# Patient Record
Sex: Female | Born: 1960 | Race: Black or African American | Hispanic: No | Marital: Married | Smoking: Never smoker
Health system: Southern US, Community
[De-identification: ages and names within clinical notes are randomized; demographics above are authoritative.]

## PROBLEM LIST (undated history)

## (undated) DIAGNOSIS — A64 Unspecified sexually transmitted disease: Secondary | ICD-10-CM

## (undated) HISTORY — PX: REDUCTION MAMMAPLASTY: SUR839

## (undated) HISTORY — PX: TUBAL LIGATION: SHX77

## (undated) HISTORY — DX: Unspecified sexually transmitted disease: A64

## (undated) HISTORY — PX: CHOLECYSTECTOMY: SHX55

## (undated) HISTORY — PX: FOOT SURGERY: SHX648

## (undated) HISTORY — PX: PARATHYROIDECTOMY: SHX19

## (undated) HISTORY — PX: VAGINAL HYSTERECTOMY: SUR661

## (undated) HISTORY — PX: BREAST SURGERY: SHX581

---

## 1998-08-01 ENCOUNTER — Other Ambulatory Visit: Admission: RE | Admit: 1998-08-01 | Discharge: 1998-08-01 | Payer: Self-pay | Admitting: Obstetrics and Gynecology

## 1999-04-21 ENCOUNTER — Ambulatory Visit (HOSPITAL_COMMUNITY): Admission: RE | Admit: 1999-04-21 | Discharge: 1999-04-21 | Payer: Self-pay | Admitting: Obstetrics and Gynecology

## 1999-04-21 ENCOUNTER — Encounter: Payer: Self-pay | Admitting: Obstetrics and Gynecology

## 1999-08-17 ENCOUNTER — Other Ambulatory Visit: Admission: RE | Admit: 1999-08-17 | Discharge: 1999-08-17 | Payer: Self-pay | Admitting: Obstetrics and Gynecology

## 1999-11-08 ENCOUNTER — Emergency Department (HOSPITAL_COMMUNITY): Admission: EM | Admit: 1999-11-08 | Discharge: 1999-11-08 | Payer: Self-pay | Admitting: Emergency Medicine

## 1999-11-11 ENCOUNTER — Inpatient Hospital Stay (HOSPITAL_COMMUNITY): Admission: EM | Admit: 1999-11-11 | Discharge: 1999-11-15 | Payer: Self-pay | Admitting: *Deleted

## 1999-11-11 ENCOUNTER — Encounter: Payer: Self-pay | Admitting: Family Medicine

## 1999-11-11 ENCOUNTER — Encounter: Admission: RE | Admit: 1999-11-11 | Discharge: 1999-11-11 | Payer: Self-pay | Admitting: Family Medicine

## 1999-11-11 ENCOUNTER — Encounter (INDEPENDENT_AMBULATORY_CARE_PROVIDER_SITE_OTHER): Payer: Self-pay | Admitting: Specialist

## 2000-01-07 ENCOUNTER — Emergency Department (HOSPITAL_COMMUNITY): Admission: EM | Admit: 2000-01-07 | Discharge: 2000-01-07 | Payer: Self-pay

## 2000-01-17 ENCOUNTER — Emergency Department (HOSPITAL_COMMUNITY): Admission: EM | Admit: 2000-01-17 | Discharge: 2000-01-17 | Payer: Self-pay | Admitting: Emergency Medicine

## 2000-07-26 ENCOUNTER — Encounter: Payer: Self-pay | Admitting: Obstetrics and Gynecology

## 2000-07-26 ENCOUNTER — Ambulatory Visit (HOSPITAL_COMMUNITY): Admission: RE | Admit: 2000-07-26 | Discharge: 2000-07-26 | Payer: Self-pay | Admitting: Obstetrics and Gynecology

## 2000-08-30 ENCOUNTER — Other Ambulatory Visit: Admission: RE | Admit: 2000-08-30 | Discharge: 2000-08-30 | Payer: Self-pay | Admitting: Obstetrics and Gynecology

## 2001-01-24 ENCOUNTER — Encounter: Admission: RE | Admit: 2001-01-24 | Discharge: 2001-01-24 | Payer: Self-pay | Admitting: Family Medicine

## 2001-01-24 ENCOUNTER — Encounter: Payer: Self-pay | Admitting: Family Medicine

## 2001-08-04 ENCOUNTER — Encounter: Payer: Self-pay | Admitting: Family Medicine

## 2001-08-04 ENCOUNTER — Ambulatory Visit (HOSPITAL_COMMUNITY): Admission: RE | Admit: 2001-08-04 | Discharge: 2001-08-04 | Payer: Self-pay | Admitting: Family Medicine

## 2001-09-01 ENCOUNTER — Other Ambulatory Visit: Admission: RE | Admit: 2001-09-01 | Discharge: 2001-09-01 | Payer: Self-pay | Admitting: Obstetrics and Gynecology

## 2002-06-28 ENCOUNTER — Ambulatory Visit (HOSPITAL_COMMUNITY): Admission: RE | Admit: 2002-06-28 | Discharge: 2002-06-28 | Payer: Self-pay | Admitting: Plastic Surgery

## 2002-06-28 ENCOUNTER — Encounter: Payer: Self-pay | Admitting: Plastic Surgery

## 2003-02-20 ENCOUNTER — Encounter: Admission: RE | Admit: 2003-02-20 | Discharge: 2003-02-20 | Payer: Self-pay | Admitting: Family Medicine

## 2003-07-05 ENCOUNTER — Ambulatory Visit (HOSPITAL_COMMUNITY): Admission: RE | Admit: 2003-07-05 | Discharge: 2003-07-05 | Payer: Self-pay | Admitting: Obstetrics and Gynecology

## 2003-10-04 ENCOUNTER — Other Ambulatory Visit: Admission: RE | Admit: 2003-10-04 | Discharge: 2003-10-04 | Payer: Self-pay | Admitting: Obstetrics and Gynecology

## 2004-07-10 ENCOUNTER — Ambulatory Visit (HOSPITAL_COMMUNITY): Admission: RE | Admit: 2004-07-10 | Discharge: 2004-07-10 | Payer: Self-pay | Admitting: Family Medicine

## 2004-07-28 ENCOUNTER — Encounter: Admission: RE | Admit: 2004-07-28 | Discharge: 2004-07-28 | Payer: Self-pay | Admitting: Family Medicine

## 2004-08-23 ENCOUNTER — Encounter: Admission: RE | Admit: 2004-08-23 | Discharge: 2004-08-23 | Payer: Self-pay | Admitting: Family Medicine

## 2004-12-25 ENCOUNTER — Ambulatory Visit (HOSPITAL_COMMUNITY): Admission: RE | Admit: 2004-12-25 | Discharge: 2004-12-25 | Payer: Self-pay | Admitting: Family Medicine

## 2005-03-19 ENCOUNTER — Ambulatory Visit (HOSPITAL_COMMUNITY): Admission: RE | Admit: 2005-03-19 | Discharge: 2005-03-20 | Payer: Self-pay | Admitting: General Surgery

## 2005-03-19 ENCOUNTER — Encounter (INDEPENDENT_AMBULATORY_CARE_PROVIDER_SITE_OTHER): Payer: Self-pay | Admitting: Specialist

## 2005-04-02 ENCOUNTER — Other Ambulatory Visit: Admission: RE | Admit: 2005-04-02 | Discharge: 2005-04-02 | Payer: Self-pay | Admitting: Obstetrics and Gynecology

## 2005-07-28 ENCOUNTER — Ambulatory Visit (HOSPITAL_COMMUNITY): Admission: RE | Admit: 2005-07-28 | Discharge: 2005-07-28 | Payer: Self-pay | Admitting: Family Medicine

## 2006-04-05 ENCOUNTER — Other Ambulatory Visit: Admission: RE | Admit: 2006-04-05 | Discharge: 2006-04-05 | Payer: Self-pay | Admitting: Obstetrics and Gynecology

## 2006-08-01 ENCOUNTER — Encounter: Admission: RE | Admit: 2006-08-01 | Discharge: 2006-08-01 | Payer: Self-pay | Admitting: Obstetrics and Gynecology

## 2007-01-02 ENCOUNTER — Ambulatory Visit: Payer: Self-pay | Admitting: Surgery

## 2007-01-16 ENCOUNTER — Ambulatory Visit: Payer: Self-pay | Admitting: Surgery

## 2007-01-16 ENCOUNTER — Encounter: Admission: RE | Admit: 2007-01-16 | Discharge: 2007-01-16 | Payer: Self-pay | Admitting: Surgery

## 2007-04-19 ENCOUNTER — Other Ambulatory Visit: Admission: RE | Admit: 2007-04-19 | Discharge: 2007-04-19 | Payer: Self-pay | Admitting: Obstetrics and Gynecology

## 2007-08-08 ENCOUNTER — Encounter: Admission: RE | Admit: 2007-08-08 | Discharge: 2007-08-08 | Payer: Self-pay | Admitting: Family Medicine

## 2008-03-14 ENCOUNTER — Ambulatory Visit: Payer: Self-pay | Admitting: Obstetrics and Gynecology

## 2008-08-13 ENCOUNTER — Encounter: Admission: RE | Admit: 2008-08-13 | Discharge: 2008-08-13 | Payer: Self-pay | Admitting: Obstetrics and Gynecology

## 2008-08-15 ENCOUNTER — Other Ambulatory Visit: Admission: RE | Admit: 2008-08-15 | Discharge: 2008-08-15 | Payer: Self-pay | Admitting: Obstetrics and Gynecology

## 2008-08-15 ENCOUNTER — Encounter: Payer: Self-pay | Admitting: Obstetrics and Gynecology

## 2008-08-15 ENCOUNTER — Ambulatory Visit: Payer: Self-pay | Admitting: Obstetrics and Gynecology

## 2009-08-15 ENCOUNTER — Encounter: Admission: RE | Admit: 2009-08-15 | Discharge: 2009-08-15 | Payer: Self-pay | Admitting: Family Medicine

## 2009-08-21 ENCOUNTER — Other Ambulatory Visit: Admission: RE | Admit: 2009-08-21 | Discharge: 2009-08-21 | Payer: Self-pay | Admitting: Obstetrics and Gynecology

## 2009-08-21 ENCOUNTER — Ambulatory Visit: Payer: Self-pay | Admitting: Obstetrics and Gynecology

## 2010-02-12 ENCOUNTER — Ambulatory Visit
Admission: RE | Admit: 2010-02-12 | Discharge: 2010-02-12 | Payer: Self-pay | Source: Home / Self Care | Attending: Women's Health | Admitting: Women's Health

## 2010-03-08 ENCOUNTER — Encounter: Payer: Self-pay | Admitting: Surgery

## 2010-06-30 NOTE — Assessment & Plan Note (Signed)
OFFICE VISIT   EZELL, MELIKIAN D  DOB:  1961-02-12                                       01/16/2007  VWUJW#:[id number]   PRIMARY CARE PHYSICIAN:  Dr. Donia Guiles.   HISTORY:  This is a 50 year old female I am seeing for bilateral lower  extremity swelling.  She has had a venous insufficiency workup which  does not show any evidence of venous reflux.  There is also no evidence  of deep venous thrombosis.  The patient was sent for CT venogram to  evaluate venous compression within her pelvis and she comes back in  today to discuss the results.  Again, she has been wearing compression  stockings a majority of the time but still is having difficulty with  swelling and mild skin changes.   PHYSICAL EXAMINATION:  Extremities:  The patient continues to have  pitting edema about to the knee.  There is, again, no ulceration.   DIAGNOSTIC STUDIES:  I have reviewed the patient's CT scan today.  I do  not see any evidence of external compression of the iliac veins.   ASSESSMENT:  Bilateral leg edema.   PLAN:  To date, the patient's venous insufficiency workup has been  negative.  Therefore, I feel this is most likely consistent with a  lymphedema.  I have referred the patient to [facility] Monday for further  treatment of her lymphedema.  The patient will follow up on a p.r.n.  basis.   Jorge Ny, MD  Electronically Signed   VWB/MEDQ  D:  01/16/2007  T:  01/17/2007  Job:  [id number]   cc:   Jonny Ruiz L. Rendall, M.D.  Donia Guiles, M.D.

## 2010-06-30 NOTE — Assessment & Plan Note (Signed)
OFFICE VISIT   Tamara Brennan, Tamara Brennan  DOB:  09-28-1960                                       01/02/2007  [id number]#:[id number]   PRIMARY CARE PHYSICIAN:  Dr. Donia Guiles.   REASON FOR VISIT:  Bilateral lower extremity swelling.   HISTORY:  This is a 50 year old female I am seeing at the request of Dr.  Priscille Kluver for evaluation of bilateral lower extremity swelling.  Patient  stated that she has been having trouble with swelling in both legs for  several years.  She was seen last year by Dr. Priscille Kluver and placed in a 20-  to 30-mm bilateral compression which initially alleviated her problems.  Over the course of the past year, however, she has continued to have  problems and was recently noted to have skin changes above the ankle.  She has never had any ulcers.  She has never had a DVT.  She comes in  today for further evaluation.   REVIEW OF SYSTEMS:  GENERAL:  Positive for weight gain.  CARDIAC:  Negative.  PULMONARY:  Negative.  GI:  Negative.  GU:  Negative.  VASCULAR:  As above.  No signs or symptoms of stroke.  NEURO:  Negative.  ORTHO:  Positive for joint and muscle pain.  PSYCH:  Negative.  ENT:  Negative.  HEME:  Negative.   PAST MEDICAL HISTORY:  Hypercholesterolemia.   PAST SURGICAL HISTORY:  1. Partial thyroidectomy.  2. Bilateral feet operations.  3. Hysterectomy.   FAMILY HISTORY:  Positive for heart disease in her mother.   SOCIAL HISTORY:  She is married with 2 children, working as a Runner, broadcasting/film/video.  She does not smoke; she has never smoked.  She does not drink alcohol.   MEDICATIONS:  Simvastatin.   ALLERGIES:  NONE.   PHYSICAL EXAMINATION:  Heart rate is 94.  Blood pressure 146/87.  O2  sats are 97%.  General:  She is well-appearing, no acute distress.  HEENT:  She is normocephalic, atraumatic.  Neck:  There is no JVD.  Sclerae are anicteric.  Cardiovascular:  Regular rate.  Pulmonary:  Respirations are nonlabored.  Abdomen:  Obese.   Extremities:  There is  2+ pitting edema up to the level of the knees on both sides.  She has  palpable dorsalis pedis ulcer.  There is hypopigmentation on the medial  aspect of both ankles.  There is no ulceration.  There are no open  wounds.   DIAGNOSTIC STUDIES:  The patient underwent duplex evaluation today.  This reveals no evidence of venous insufficiency.  There is no evidence  of DVT.   ASSESSMENT AND PLAN:  Bilateral lower extremity swelling.  Plan at this  point in time, I do not feel this is related to venous insufficiency  given her normal duplex.  It is possible this is lymphedema, however,  prior to proceeding with lymphedema referral I am going to obtain a CT  abdomen and pelvis to rule out mass compression of her iliac veins.  The  other things to consider would be cardiac or renal dysfunction leading  to her bilateral swelling.  Patient will have a CT scan.  She will come  back and see me in 1 week.   Jorge Ny, MD  Electronically Signed   VWB/MEDQ  Brennan:  01/02/2007  T:  01/03/2007  Job:  [id number]   cc:   Donia Guiles, M.Brennan.  John L. Rendall, M.Brennan.

## 2010-06-30 NOTE — Procedures (Signed)
LOWER EXTREMITY VENOUS REFLUX EXAM   INDICATION:  Bilateral lower extremity swelling and edema.   EXAM:  Using color-flow imaging and pulse Doppler spectral analysis, the  right and left common femoral, superficial femoral, popliteal, posterior  tibial, greater and lesser saphenous veins are evaluated.  There is no  evidence suggesting deep venous insufficiency in the right and left  lower extremities.   The right and left saphenofemoral junctions are competent.  The right  and left GSV is competent.  The right and left proximal short saphenous vein demonstrates  competency.   GSV Diameter (used if found to be incompetent only)                                            Right    Left  Proximal Greater Saphenous Vein           cm       cm  Proximal-to-mid-thigh                     cm       cm  Mid thigh                                 cm       cm  Mid-distal thigh                          cm       cm  Distal thigh                              cm       cm  Knee                                      cm       cm   IMPRESSION:  1. The right and left greater saphenous veins show no evidence of      venous reflux.  2. The right and left greater saphenous veins are not aneurysmal.  3. The right and left greater saphenous veins are not tortuous.  4. The deep venous system is competent.  5. The right and left lesser saphenous veins are competent.  6. No evidence of deep venous thrombosis or superficial vein      thrombosis.   ___________________________________________  V. Charlena Cross, MD   AS/MEDQ  D:  01/02/2007  T:  01/03/2007  Job:  [id number]

## 2010-07-03 NOTE — Op Note (Signed)
NAMENOMIE, BUCHBERGER              ACCOUNT NO.:  [id number]   MEDICAL RECORD NO.:  [id number]          PATIENT TYPE:  OIB   LOCATION:  5708                         FACILITY:  [facility]   PHYSICIAN:  Gita Kudo, M.D. DATE OF BIRTH:  25-Sep-1960   DATE OF PROCEDURE:  03/19/2005  DATE OF DISCHARGE:                                 OPERATIVE REPORT   OPERATIVE PROCEDURES:  1.  Left neck exploration.  2.  Left thyroid lobectomy.  3.  Excision, left parathyroid adenoma, inferior, in superior mediastinum.   SURGEON:  Gita Kudo, M.D.   ASSISTANT:  Velora Heckler, MD   ANESTHESIA:  General endotracheal.   PREOPERATIVE DIAGNOSIS:  Parathyroid adenoma.   POSTOPERATIVE DIAGNOSIS:  Parathyroid adenoma.   CLINICAL SUMMARY:  A 50 year old female admitted for elective parathyroid  surgery.  Se has no symptoms of hyperparathyroidism.  She does indeed have  elevated calciums of approximately 11, parathormone level was elevated at  76.  A sestamibi scan showed a parathyroid adenoma in the left lower lobe or  below the left lobe of the thyroid gland.   OPERATIVE FINDINGS:  The the patient was eventually found to have a  parathyroid adenoma, confirmed by frozen section, in her superior  mediastinum.  We had thoroughly explored the left neck and actually did a  left thyroid lobectomy, thinking that since we could not feel the gland it  might be in the thyroid, and the pathologist reported it was not there.  The  left superior parathyroid looked normal and was left alone.  The recurrent  laryngeal nerve was identified and not injured.   OPERATIVE PROCEDURE:  Under satisfactory general endotracheal anesthesia,  having received previous nuclear medicine injection, the patient was  positioned, prepped and draped in a standard fashion.  The NeoProbe was used  to scan her neck, and we did not get very high counts despite careful  scanning of the entire neck and especially on the left side.   There was some  more scanning in the inferior portion of the neck, although not real hot.   A limited left transverse incision was made in the neck, which was extended  as the operation continued.  The platysma was divided and then the midline  opened.  The strap muscles retracted laterally.   We then did a very long and tedious dissection of the neck looking for the  parathyroid.  We did not readily see it at the inferior pole where it was  supposed to be.  We did go down following the thymus tissue and did not see  it in that area.  Then the dissection included careful exploration of the  tracheoesophageal groove, retroesophageal tissue, carotid sheath.  We then  felt that it might be in the left thyroid gland itself.   Therefore, the inferior artery and vein were divided between ties and clips  and the gland, which was already partially mobilized, was mobilized medially  off the trachea with the cautery.  The superior pole was divided between  clamps and ties of 2-0 silk and then the specimen dissected  over to the  isthmus, which was clamped, transected.  The specimen was then sent to the  pathologist.  Following this we checked the rest of the neck carefully,  looking to see if we had missed anything.  When the report came back from  the pathologist showing no parathyroid, finding one normal parathyroid and  referring to the report that was present on the left side inferiorly, we  again went down into the mediastinum.  We went down to the level and below  of the innominate vein and pulling up thymic tissue, actually found a nodule  in the thymic tissue that was in the superior mediastinum.  The distal  portion of the tissue was divided between metal clips and this specimen sent  for frozen section.   While waiting for that, the wound was thoroughly lavaged with saline and  checked for hemostasis.  Some Surgicel was placed deep in the wound, which  was quite hemostatic at that time.   Following this, the wound was closed in  layers with midline sutures of 3-0 Vicryl and platysma sutures of the same.  Subcu was approximated 4-0 Vicryl and the skin approximated with Steri-  Strips.   During the closure, the pathologist reported that the abnormality that we  found in the thymic tissue in the superior mediastinum was indeed a  parathyroid adenoma.  There were no complications, and the sponge and needle  counts were correct.  A sterile dressing was applied.  The patient went to  the recovery room.           ______________________________  Gita Kudo, M.D.     MRL/MEDQ  D:  03/19/2005  T:  03/20/2005  Job:  [id number]   cc:   Donia Guiles, M.D.  Fax: [phone]

## 2010-07-03 NOTE — H&P (Signed)
[facility]  Patient:    Tamara Brennan, Tamara Brennan                     MRN: [id number] Adm. Date:  [id number] Disc. Date: [id number] Attending:  Lorre Nick CC:         Desma Maxim, M.D.   History and Physical  CHIEF COMPLAINT:  Abdominal pain.  HISTORY OF PRESENT ILLNESS:  This is a 50 year old black female without any significant prior GI problems.  She noted the onset of right upper quadrant pain and epigastric pain on Sunday morning, November 08, 1999, at 1 a.m.  The pain has been persistent since that time.  She has vomited at least 15 times, according to her.  She went to the [facility] on November 08, 1999, and had laboratory work which was reportedly "normal."  She was not given a diagnosis and told to follow up with her regular medical doctor.  Her pain has persisted.  She has felt very bad and has had fever and chills.  She had two loose stools, but diarrhea has not been a prominent part of her history.  She was evaluated by Desma Maxim, M.D., in the office today.  He obtained a CT scan, which shows a distended, thick-walled gallbladder with extensive circumferential inflammatory changes. There is a possibility of some stones in the gallbladder.  The common bile duct did not appear dilated.  The liver was slightly enlarged, possibly consistent with fatty infiltration.  There was no other focal inflammatory problem in the abdomen or pelvis by CT scan.  Desma Maxim, M.D., called me and the patient was brought to the [facility] Emergency Room for further evaluation.  PAST MEDICAL HISTORY:  The patient denies medical problems.  Specifically she denies any history of diabetes, pulmonary problems, or cardiovascular problems.  She does not have sickle cell disease.  She does have a history of foot surgery, bilateral tubal ligation, cesarean section, vaginal hysterectomy, and ear  surgery.  CURRENT MEDICATIONS:  None.  DRUG ALLERGIES:  None known.  SOCIAL HISTORY:  The patient is married.  She has two children.  She is a housewife.  Her husband works for [employer] tobacco.  She denies the use of tobacco or alcohol.  FAMILY HISTORY:  Her mother died at age 30 of a heart attack.  Father living at age 92 with no medical problems.  One sister and one brother living and well.  REVIEW OF SYSTEMS:  All systems are reviewed and are noncontributory.  PHYSICAL EXAMINATION:  A pleasant, somewhat overweight, black female in moderately severe distress.  VITAL SIGNS:  Heart rate 133.  Initial temperature 99.3 degrees, repeat 102 degrees.  Respiratory rate 22, blood pressure 168/95.  HEENT:  Sclerae clear.  Extraocular movements intact.  NECK:  Supple.  Nontender.  No mass.  No adenopathy.  No bruit.  No jugular venous distention.  LUNGS:  Clear to auscultation.  BACK:  No CVA tenderness.  BREASTS:  Not examined.  HEART:  Regular rate and rhythm.  No murmur.  ABDOMEN:  Somewhat obese.  She is very tender with involuntary guarding, rigidity, and spasticity in the right upper quadrant.  I cannot tell if there is a mass or not.  She has well-healed laparoscopy scars.  The rest of her abdomen is fairly soft, although she does have some referred tenderness to the right upper quadrant.  EXTREMITIES:  Free range of motion.  No deformity.  No edema.  Good pulses. There are some scars on both feet.  NEUROLOGIC:  Exam grossly within normal limits.  ADMISSION LABORATORY DATA:  CT scan as described above.  Hemoglobin 13.3, hematocrit 37.9, white blood cell count 19,700. Comprehensive metabolic panel, amylase, lipase, and urinalysis are pending.  IMPRESSION:  Severe acute cholecystitis, possible cholelithiasis.  Question whether she has a gangrenous gallbladder.  PLAN:  The patient will be taken to the operating room for cholecystectomy today.  I have discussed the  indications and details of the procedure with the patient and her husband.  The risks and complications are outlined, including, but not limited to conversion to open laparotomy, which is likely, bleeding, infection, bile leak, bile duct injury, intestinal injury, wound infection, and other unforeseen complications.  They seem to understand all of these issues well.  At this time all of their questions were answered.  They agree with my plan to take her to the OR. DD:  11/11/99 TD:  11/11/99 Job: [id number]

## 2010-07-03 NOTE — Op Note (Signed)
[facility]  Patient:    Tamara Brennan, Tamara Brennan                     MRN: [id number] Proc. Date: 11/11/99 Adm. Date:  [id number] Attending:  Brandy Hale CC:         Desma Maxim, M.D.                           Operative Report  PREOPERATIVE DIAGNOSES:  Acute cholecystitis with probable cholelithiasis.  POSTOPERATIVE DIAGNOSES:  Acute gangrenous cholecystitis.  OPERATION PERFORMED:  Laparoscopic cholecystectomy.  SURGEON:  Dr. Claud Kelp.  FIRST ASSISTANT:  Dr. Lebron Conners.  INDICATIONS FOR PROCEDURE:  This is a 50 year old black female who presents with a 3 day history of right upper quadrant pain, nausea and vomiting. CT scan performed today shows a very thick walled gallbladder with possible stones. On examination, she is somewhat over weight but has localized tenderness, guarding and rebound tenderness in the right upper quadrant and with a possible mass. She was brought to the operating room urgently for cholecystectomy.  OPERATIVE FINDINGS:  The gallbladder was gangrenous, thick walled, and acutely inflamed. The anatomy of the cystic duct, cystic artery and common bile duct appeared conventional. The liver was large and suggested fatty infiltration. I did not detect any abnormality of the small intestine, large intestine, stomach of duodenum.  DESCRIPTION OF PROCEDURE:  Following the induction of the general endotracheal anesthesia, the patients abdomen was prepped and draped in a sterile fashion. A 0.5% Marcaine with epinephrine was used as a local infiltration anesthetic. A transverse incision was made at the lower end of the umbilicus. The fascia was incised at the midline and the abdominal cavity entered under direct vision. A 10 mm Hasson trocar was inserted and secured to the pursestring suture of #0 Vicryl. Pneumoperitoneum was created. A video camera was inserted with visualization and findings as described above. We  placed a 10 mm trocar in the subxiphoid region, two 5 mm trocars in the right mid abdomen and another 5 mm trocar in the left upper quadrant to retract the omentum and transverse colon out of the way. We identified the region of the gallbladder and we changed the omentum away from the gallbladder. There was a very thick peel and inflammatory exudate around this but it actually peeled off intact and we could identify very inflamed gallbladder with patchy areas of gangrene. We slowly with good retraction dissected out the cystic duct and cystic artery. We isolated the cystic artery and secured it with metal clips and divided it. This gave [place] good visualization of a medium length cystic duct which was of small caliber. We retracted this laterally until we could clearly the identify the junction of the cystic duct with the infundibulum of the gallbladder and we clearly identified the region of the common bile duct. The cystic duct was then secured with metal clips and divided. The lumen of the cystic duct was quite small. We then dissected the gallbladder from its bed somewhat with blunt dissection and somewhat with electrocautery. The dissection was fairly uneventful. Small bleeders were controlled with electrocautery. We ultimately were able to detach the gallbladder from its bed, place it in a specimen bag and remove it through the umbilical port.  We did spend a fair amount of time irrigating the subhepatic space and the subphrenic space until all the irrigation fluid was clear. The clips appeared quite  secure on the cystic duct. There was no bleeding and there was no bile leak whatsoever. We chose not to place a drain. The trocars were removed under direct vision and there was no bleeding from the trocar sites. The pneumoperitoneum was released. The fascia at the umbilicus was closed with #0 Vicryl suture. The skin incisions were closed with subcuticular sutures of 4-0 Vicryl and  Steri-Strips. Clean bandages were placed and the patient taken to the recovery room in stable condition. Estimated blood loss was about 40 cc. Complications none. Sponge, needle and instrument counts were correct. DD:  11/11/99 TD:  11/12/99 Job: [id number]

## 2010-07-03 NOTE — Discharge Summary (Signed)
[facility]  Patient:    Tamara Brennan, Tamara Brennan                     MRN: [id number] Adm. Date:  [id number] Disc. Date: [id number] Attending:  Brandy Hale CC:         Desma Maxim, M.D.   Discharge Summary  FINAL DIAGNOSIS:  Acute gangrenous cholecystitis.  OPERATION PERFORMED:  Laparoscopic cholecystectomy.  DATE OF SURGERY:  November 11, 1999  ADMISSION HISTORY:  This is a 50 year old black female who noted the onset of right upper quadrant pain on Sunday morning, November 08, 1999.  The patient has persisted, and she has vomited numerous times.  She was evaluated at [facility] Emergency Room on September 23, lab work was reportedly normal, and she was discharged to follow up with her medical doctor.  She has continued to have pain, fever, and chills.  She saw Dr. Donia Guiles on the day of admission.  He obtained a CT scan which showed extended thick-walled gallbladder with extensive circumferential inflammatory changes and a possibility of some stones in the gallbladder.  The common bile duct was not dilated.  The patient was brought to the [facility] Emergency Room and admitted for further management.  PAST MEDICAL HISTORY:  She has no medical problems.  She does have a history of foot surgery, bilateral tubal ligation, cesarean section, vaginal hysterectomy, and ear surgery.  CURRENT MEDICATIONS:  None.  DRUG ALLERGIES:  None known.  ADMISSION PHYSICAL EXAMINATION:  VITAL SIGNS:  Heart rate 133, temperature 99.3, repeat was 102.0, respiratory rate 22, blood pressure 168/95.  GENERAL APPEARANCE:  Pleasant, somewhat overweight black female in moderately severe distress.  HEENT:  Sclerae clear.  NECK:  Supple, no mass.  CHEST:  Lungs clear.  CARDIOVASCULAR:  Regular rate and rhythm, no murmur.  ABDOMEN:  Somewhat obese.  Tender with involuntary guarding, rigidity, and spasticity in the right upper quadrant.  Well-healed  laparoscopy scars.  The rest of the abdomen is fairly soft.  ADMISSION LABORATORY DATA:  Hemoglobin 13.3, white blood cell count 19,700. Liver function tests essentially normal.  Urine microscopic normal.  Blood cultures returned normal.  HOSPITAL COURSE:  The patient was admitted, started on intravenous antibiotics.  She was taken to the operating room on the day of admission and underwent a laparoscopic cholecystectomy.  Operative findings were consistent with a gangrenous cholecystitis.  Final pathology report showed acute cholecystitis with extensive necrosis and some gallstones.  Postoperatively, the patient progressed slowly, but steadily.  She had some fever for two to three days.  She also had problems with atelectasis for two to three days.  With continuation of her antibiotics, mobilization, ambulation, and pulmonary toilet she improved.  We kept her on antibiotics for several days because of the gangrenous gallbladder.  She progressed slowly, but steadily, in her diet and activities.  She was discharged on November 15, 1999.  At that time she was tolerating her diet, was feel well, and was asking to go home.  She was afebrile.  Her oxygen saturations were anywhere from 90-97% on room air.  Her lungs were clear on exam, and her abdomen was benign.  FOLLOWUP:  She was asked to return to see me in the office in three to four weeks.  DISCHARGE MEDICATIONS:  She was given a prescription for Vicodin for pain. DD:  11/23/99 TD:  11/24/99 Job: [id number]

## 2010-07-24 ENCOUNTER — Other Ambulatory Visit: Payer: Self-pay | Admitting: Obstetrics and Gynecology

## 2010-07-24 DIAGNOSIS — Z1231 Encounter for screening mammogram for malignant neoplasm of breast: Secondary | ICD-10-CM

## 2010-08-18 ENCOUNTER — Ambulatory Visit
Admission: RE | Admit: 2010-08-18 | Discharge: 2010-08-18 | Disposition: A | Discharge status: Home / Self Care | Payer: 59 | Source: Ambulatory Visit | Attending: Obstetrics and Gynecology | Admitting: Obstetrics and Gynecology

## 2010-08-18 DIAGNOSIS — Z1231 Encounter for screening mammogram for malignant neoplasm of breast: Secondary | ICD-10-CM

## 2010-08-25 ENCOUNTER — Encounter (INDEPENDENT_AMBULATORY_CARE_PROVIDER_SITE_OTHER): Payer: 59 | Admitting: Obstetrics and Gynecology

## 2010-08-25 ENCOUNTER — Other Ambulatory Visit (HOSPITAL_COMMUNITY)
Admission: RE | Admit: 2010-08-25 | Discharge: 2010-08-25 | Disposition: A | Discharge status: Home / Self Care | Payer: 59 | Source: Ambulatory Visit | Attending: Obstetrics and Gynecology | Admitting: Obstetrics and Gynecology

## 2010-08-25 ENCOUNTER — Other Ambulatory Visit: Payer: Self-pay | Admitting: Obstetrics and Gynecology

## 2010-08-25 DIAGNOSIS — Z124 Encounter for screening for malignant neoplasm of cervix: Secondary | ICD-10-CM | POA: Insufficient documentation

## 2010-08-25 DIAGNOSIS — N951 Menopausal and female climacteric states: Secondary | ICD-10-CM

## 2010-08-25 DIAGNOSIS — Z01419 Encounter for gynecological examination (general) (routine) without abnormal findings: Secondary | ICD-10-CM

## 2011-07-26 ENCOUNTER — Other Ambulatory Visit: Payer: Self-pay | Admitting: Obstetrics and Gynecology

## 2011-07-26 DIAGNOSIS — Z1231 Encounter for screening mammogram for malignant neoplasm of breast: Secondary | ICD-10-CM

## 2011-08-16 ENCOUNTER — Encounter: Payer: Self-pay | Admitting: Gynecology

## 2011-08-16 DIAGNOSIS — A64 Unspecified sexually transmitted disease: Secondary | ICD-10-CM | POA: Insufficient documentation

## 2011-08-26 ENCOUNTER — Encounter: Payer: Self-pay | Admitting: Obstetrics and Gynecology

## 2011-08-26 ENCOUNTER — Ambulatory Visit (INDEPENDENT_AMBULATORY_CARE_PROVIDER_SITE_OTHER): Payer: 59 | Admitting: Obstetrics and Gynecology

## 2011-08-26 ENCOUNTER — Other Ambulatory Visit (HOSPITAL_COMMUNITY)
Admission: RE | Admit: 2011-08-26 | Discharge: 2011-08-26 | Disposition: A | Discharge status: Home / Self Care | Payer: 59 | Source: Ambulatory Visit | Attending: Obstetrics and Gynecology | Admitting: Obstetrics and Gynecology

## 2011-08-26 VITALS — BP 126/78 | Ht 67.0 in | Wt 212.0 lb

## 2011-08-26 DIAGNOSIS — Z780 Asymptomatic menopausal state: Secondary | ICD-10-CM

## 2011-08-26 DIAGNOSIS — Z01419 Encounter for gynecological examination (general) (routine) without abnormal findings: Secondary | ICD-10-CM

## 2011-08-26 MED ORDER — VALACYCLOVIR HCL 500 MG PO TABS: 500.0000 mg | ORAL_TABLET | Freq: Every day | ORAL | Status: DC

## 2011-08-26 NOTE — Progress Notes (Signed)
Patient came to see me today for her annual GYN exam. She has had a vaginal hysterectomy. She has never had an abnormal Pap smear. She is scheduled her yearly mammogram. She is starting to notice more hot flashes. Last year her [facility] was 6. 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 Her husband also has HSV-2 and has been treated. We had offered the patient daily suppression but she has been reluctant  to do so and only takes Valtrex when she gets a  Recurrence. She is having no vaginal bleeding. She is having no pelvic pain. She does her lab through PCP.  HEENT: Within normal limits. Kennon Portela present. Neck: No masses. Supraclavicular lymph nodes: Not enlarged. Breasts: Examined in both sitting and lying position. Symmetrical without skin changes or masses. Abdomen: Soft no masses guarding or rebound. No hernias. Pelvic: External within normal limits. BUS within normal limits. Vaginal examination shows good estrogen effect, no cystocele enterocele or rectocele. Cervix and uterus absent. Adnexa within normal limits. Rectovaginal confirmatory. Extremities within normal limits.  Assessment: #1. HSV-2 #2. New menopausal symptoms  Plan: Continue yearly mammograms. FSH checked. Call patient with results. Encouraged  her to do daily Valtrex. She will decide.The new Pap smear guidelines were discussed with the patient. She requested Pap smear which was done.

## 2011-08-27 LAB — URINALYSIS W MICROSCOPIC + REFLEX CULTURE
Casts: NONE SEEN
Crystals: NONE SEEN
Glucose, UA: NEGATIVE mg/dL
Nitrite: NEGATIVE
Protein, ur: NEGATIVE mg/dL
Specific Gravity, Urine: 1.014 (ref 1.005–1.030)
Squamous Epithelial / LPF: NONE SEEN
Urobilinogen, UA: 0.2 mg/dL (ref 0.0–1.0)

## 2011-08-30 ENCOUNTER — Ambulatory Visit
Admission: RE | Admit: 2011-08-30 | Discharge: 2011-08-30 | Disposition: A | Discharge status: Home / Self Care | Payer: 59 | Source: Ambulatory Visit | Attending: Obstetrics and Gynecology | Admitting: Obstetrics and Gynecology

## 2011-08-30 DIAGNOSIS — Z1231 Encounter for screening mammogram for malignant neoplasm of breast: Secondary | ICD-10-CM

## 2011-09-07 ENCOUNTER — Other Ambulatory Visit: Payer: Self-pay | Admitting: Gastroenterology

## 2011-09-09 ENCOUNTER — Telehealth: Payer: Self-pay | Admitting: *Deleted

## 2011-09-09 MED ORDER — FLUCONAZOLE 150 MG PO TABS: 150.0000 mg | ORAL_TABLET | Freq: Once | ORAL | Status: AC

## 2011-09-09 NOTE — Telephone Encounter (Signed)
(  Dr.G patient) Pt calling c/o vaginal itching only x 2 days now. 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 Pt has a funeral to attend today and would like something to relieve itching. No odor, no discharge, no other complaints. Please advise

## 2011-09-09 NOTE — Telephone Encounter (Signed)
Please call and Diflucan 150 by mouth x1 dose, office visit if no relief

## 2011-09-09 NOTE — Telephone Encounter (Signed)
rx sent, left on vm the below.

## 2011-09-10 ENCOUNTER — Ambulatory Visit (INDEPENDENT_AMBULATORY_CARE_PROVIDER_SITE_OTHER): Payer: 59 | Admitting: Gynecology

## 2011-09-10 ENCOUNTER — Encounter: Payer: Self-pay | Admitting: Gynecology

## 2011-09-10 VITALS — BP 126/72

## 2011-09-10 DIAGNOSIS — Z113 Encounter for screening for infections with a predominantly sexual mode of transmission: Secondary | ICD-10-CM

## 2011-09-10 DIAGNOSIS — N760 Acute vaginitis: Secondary | ICD-10-CM

## 2011-09-10 DIAGNOSIS — L293 Anogenital pruritus, unspecified: Secondary | ICD-10-CM

## 2011-09-10 DIAGNOSIS — B9689 Other specified bacterial agents as the cause of diseases classified elsewhere: Secondary | ICD-10-CM

## 2011-09-10 DIAGNOSIS — N898 Other specified noninflammatory disorders of vagina: Secondary | ICD-10-CM

## 2011-09-10 DIAGNOSIS — A499 Bacterial infection, unspecified: Secondary | ICD-10-CM

## 2011-09-10 LAB — WET PREP FOR TRICH, YEAST, CLUE
Trich, Wet Prep: NONE SEEN
Yeast Wet Prep HPF POC: NONE SEEN

## 2011-09-10 MED ORDER — CLINDAMYCIN PHOSPHATE 2 % VA CREA: 1.0000 | TOPICAL_CREAM | Freq: Every day | VAGINAL | Status: AC

## 2011-09-10 NOTE — Progress Notes (Signed)
Patient 51 year old who presented to the office today complaining of several week history of vulvar irritation and discharge. She had called the office on July 25 and was phoned in Diflucan 150 mg which she took and has not resolved her symptoms. Patient with prior history of vaginal hysterectomy. Patient also has had a history of recurrent HSV but has not been taking her daily Valtrex for suppression. She also states that her husband also has history of HSV.  Exam: Pelvic: Bartholin urethra Skene glands within normal limits Vagina: Fishy white discharge was noted. Vaginal cuff intact Bimanual exam: Not done Rectal exam: Not done  Wet prep demonstrated evidence of clue cells as well as to numerous to count bacteria and positive Amine  A GC and Chlamydia culture was also obtained results pending at time of this dictation  Assessment/plan: Bacterial vaginosis will be treated with Cleocin 2% vaginal cream to apply each bedtime for 5 nights.

## 2011-09-10 NOTE — Patient Instructions (Addendum)
Bacterial Vaginosis Bacterial vaginosis (BV) is a vaginal infection where the normal balance of bacteria in the vagina is disrupted. The normal balance is then replaced by an overgrowth of certain bacteria. There are several different kinds of bacteria that can cause BV. BV is the most common vaginal infection in women of childbearing age. CAUSES   The cause of BV is not fully understood. BV develops when there is an increase or imbalance of harmful bacteria.   Some activities or behaviors can upset the normal balance of bacteria in the vagina and put women at increased risk including:   Having a new sex partner or multiple sex partners.   Douching.   Using an intrauterine device (IUD) for contraception.   It is not clear what role sexual activity plays in the development of BV. However, women that have never had sexual intercourse are rarely infected with BV.  Women do not get BV from toilet seats, bedding, swimming pools or from touching objects around them.  SYMPTOMS   Grey vaginal discharge.   A fish-like odor with discharge, especially after sexual intercourse.   Itching or burning of the vagina and vulva.   Burning or pain with urination.   Some women have no signs or symptoms at all.  DIAGNOSIS  Your caregiver must examine the vagina for signs of BV. Your caregiver will perform lab tests and look at the sample of vaginal fluid through a microscope. They will look for bacteria and abnormal cells (clue cells), a pH test higher than 4.5, and a positive amine test all associated with BV.  RISKS AND COMPLICATIONS   Pelvic inflammatory disease (PID).   Infections following gynecology surgery.   Developing HIV.   Developing herpes virus.  TREATMENT  Sometimes BV will clear up without treatment. However, all women with symptoms of BV should be treated to avoid complications, especially if gynecology surgery is planned. Female partners generally do not need to be treated. However,  BV may spread between female sex partners so treatment is helpful in preventing a recurrence of BV.   BV may be treated with antibiotics. The antibiotics come in either pill or vaginal cream forms. Either can be used with nonpregnant or pregnant women, but the recommended dosages differ. These antibiotics are not harmful to the baby.   BV can recur after treatment. If this happens, a second round of antibiotics will often be prescribed.   Treatment is important for pregnant women. If not treated, BV can cause a premature delivery, especially for a pregnant woman who had a premature birth in the past. All pregnant women who have symptoms of BV should be checked and treated.   For chronic reoccurrence of BV, treatment with a type of prescribed gel vaginally twice a week is helpful.  HOME CARE INSTRUCTIONS   Finish all medication as directed by your caregiver.   Do not have sex until treatment is completed.   Tell your sexual partner that you have a vaginal infection. They should see their caregiver and be treated if they have problems, such as a mild rash or itching.   Practice safe sex. Use condoms. Only have 1 sex partner.  PREVENTION  Basic prevention steps can help reduce the risk of upsetting the natural balance of bacteria in the vagina and developing BV:  Do not have sexual intercourse (be abstinent).   Do not douche.   Use all of the medicine prescribed for treatment of BV, even if the signs and symptoms go away.     Tell your sex partner if you have BV. That way, they can be treated, if needed, to prevent reoccurrence.  SEEK MEDICAL CARE IF:   Your symptoms are not improving after 3 days of treatment.   You have increased discharge, pain, or fever.  MAKE SURE YOU:   Understand these instructions.   Will watch your condition.   Will get help right away if you are not doing well or get worse.  FOR MORE INFORMATION  Division of STD Prevention (DSTDP), Centers for Disease  Control and Prevention: www.cdc.gov/std American Social Health Association (ASHA): www.ashastd.org  Document Released: 02/01/2005 Document Revised: 01/21/2011 Document Reviewed: 07/25/2008 ExitCare Patient Information 2012 [employer]. 

## 2011-09-11 LAB — GC/CHLAMYDIA PROBE AMP, GENITAL
Chlamydia, DNA Probe: NEGATIVE
GC Probe Amp, Genital: NEGATIVE

## 2011-09-15 ENCOUNTER — Telehealth: Payer: Self-pay | Admitting: *Deleted

## 2011-09-15 MED ORDER — FLUCONAZOLE 150 MG PO TABS: 150.0000 mg | ORAL_TABLET | Freq: Every day | ORAL | Status: AC

## 2011-09-15 NOTE — Telephone Encounter (Signed)
Diflucan 150 mg daily for 4 days. Office visit if it persists.

## 2011-09-15 NOTE — Telephone Encounter (Signed)
Pt was giving Cleocin 2% vaginal cream to apply each bedtime for 5 nights, 09/10/11 with JF. Pt called today c/o vaginal itching only, no discharge nor foul smell. Pt would like rx to relieve. Please advise

## 2011-09-15 NOTE — Telephone Encounter (Signed)
Pt informed with the below note, rx sent. 

## 2011-11-23 ENCOUNTER — Encounter: Payer: 59 | Admitting: Vascular Surgery

## 2011-12-13 ENCOUNTER — Encounter: Payer: Self-pay | Admitting: Vascular Surgery

## 2011-12-14 ENCOUNTER — Encounter (INDEPENDENT_AMBULATORY_CARE_PROVIDER_SITE_OTHER): Payer: 59 | Admitting: *Deleted

## 2011-12-14 ENCOUNTER — Encounter: Payer: Self-pay | Admitting: Vascular Surgery

## 2011-12-14 ENCOUNTER — Other Ambulatory Visit: Payer: Self-pay | Admitting: *Deleted

## 2011-12-14 ENCOUNTER — Ambulatory Visit (INDEPENDENT_AMBULATORY_CARE_PROVIDER_SITE_OTHER): Payer: 59 | Admitting: Vascular Surgery

## 2011-12-14 VITALS — BP 116/76 | HR 78 | Resp 16 | Ht 67.0 in | Wt 213.0 lb

## 2011-12-14 DIAGNOSIS — M7989 Other specified soft tissue disorders: Secondary | ICD-10-CM

## 2011-12-14 DIAGNOSIS — M79609 Pain in unspecified limb: Secondary | ICD-10-CM

## 2011-12-14 NOTE — Progress Notes (Signed)
Subjective:     Patient ID: Tamara Brennan, female   DOB: [birth year], 51 y.o.   MRN: [id number]  HPI this 51 year old female is referred for bilateral venous insufficiency. 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 She is noted spider veins in both thigh areas over the past 5-6 years which have become more prominent. She denies any pain or distal edema at the beginning of the day but her swelling does occur as the day progresses particularly in the calf and ankle. She is not wear elastic compression stockings nor elevate her legs at work which he cannot do. She has a history of DVT thrombophlebitis or pulmonary embolus. She has been previously evaluated at [facility] who recommended 6 courses of sclerotherapy for each lower extremity  Past Medical History  Diagnosis Date  . Fibroid   . STD (sexually transmitted disease)     HSV ll    History  Substance Use Topics  . Smoking status: Never Smoker   . Smokeless tobacco: Not on file  . Alcohol Use: No    Family History  Problem Relation Age of Onset  . Heart disease Mother   . Hypertension Sister   . Diabetes Maternal Aunt     No Known Allergies  Current outpatient prescriptions:Multiple Vitamin (MULTIVITAMIN) tablet, Take 1 tablet by mouth daily., Disp: , Rfl: ;  valACYclovir (VALTREX) 500 MG tablet, Take 1 tablet (500 mg total) by mouth daily., Disp: 90 tablet, Rfl: 4  BP 116/76  Pulse 78  Resp 16  Ht 5\' 7"  (1.702 m)  Wt 213 lb (96.616 kg)  BMI 33.36 kg/m2  Body mass index is 33.36 kg/(m^2).        Review of Systems denies chest pain, dyspnea on exertion, PND, orthopnea, hemoptysis, claudication.     Objective:   Physical Exam blood pressure 116/76 heart rate 78 respirations 16 Gen.-alert and oriented x3 in no apparent distress HEENT normal for age Lungs no rhonchi or wheezing Cardiovascular regular rhythm no murmurs carotid pulses 3+ palpable no bruits audible Abdomen soft nontender no palpable  masses Musculoskeletal free of  major deformities Skin clear -no rashes Neurologic normal Lower extremities 3+ femoral and dorsalis pedis pulses palpable bilaterally with no edema Multiple spider veins in the medial aspect of the thighs and the lateral proximal thigh areas near the buttock. 1+ edema distally bilaterally. No bulging varicosities or hyperpigmentation is noted.  Today I ordered bilateral venous duplex exam which I reviewed and interpreted. There is no reflux in either great or small saphenous vein and there is no DVT. There is some deep venous reflux bilaterally.       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 We'll discuss that further with her and she will decide.

## 2012-05-31 ENCOUNTER — Encounter (INDEPENDENT_AMBULATORY_CARE_PROVIDER_SITE_OTHER): Payer: 59 | Admitting: *Deleted

## 2012-05-31 DIAGNOSIS — I83893 Varicose veins of bilateral lower extremities with other complications: Secondary | ICD-10-CM

## 2012-07-25 ENCOUNTER — Other Ambulatory Visit: Payer: Self-pay

## 2012-07-25 DIAGNOSIS — Z1231 Encounter for screening mammogram for malignant neoplasm of breast: Secondary | ICD-10-CM

## 2012-08-16 ENCOUNTER — Telehealth: Payer: Self-pay | Admitting: *Deleted

## 2012-08-16 NOTE — Telephone Encounter (Signed)
Mrs. Tamara Brennan c/o pain on inner aspect of ankles.  No c/o of swelling of lower extremities.  Last seen by Betti Cruz MD with venous reflux exam on 12-14-2011.  Reviewed reflux exam and doctor's finding and suggestions with her.  Per Dr. Candie Chroman  Office note on 12-14-2011, she has bilateral deep venous reflux.  Suggested that she wear her compression stockings daily and elevate her legs frequently.  Mrs. Tamara Brennan verbalized understanding and will call if symptoms worsen or if she has questions or concerns.

## 2012-08-28 ENCOUNTER — Encounter: Payer: Self-pay | Admitting: Women's Health

## 2012-08-28 ENCOUNTER — Ambulatory Visit (INDEPENDENT_AMBULATORY_CARE_PROVIDER_SITE_OTHER): Payer: 59 | Admitting: Women's Health

## 2012-08-28 VITALS — BP 122/72 | Ht 67.0 in | Wt 208.0 lb

## 2012-08-28 DIAGNOSIS — D259 Leiomyoma of uterus, unspecified: Secondary | ICD-10-CM

## 2012-08-28 DIAGNOSIS — Z01419 Encounter for gynecological examination (general) (routine) without abnormal findings: Secondary | ICD-10-CM

## 2012-08-28 DIAGNOSIS — B009 Herpesviral infection, unspecified: Secondary | ICD-10-CM

## 2012-08-28 DIAGNOSIS — Z13220 Encounter for screening for lipoid disorders: Secondary | ICD-10-CM

## 2012-08-28 DIAGNOSIS — Z833 Family history of diabetes mellitus: Secondary | ICD-10-CM

## 2012-08-28 DIAGNOSIS — D219 Benign neoplasm of connective and other soft tissue, unspecified: Secondary | ICD-10-CM

## 2012-08-28 LAB — CBC WITH DIFFERENTIAL/PLATELET
Eosinophils Absolute: 0.2 10*3/uL (ref 0.0–0.7)
Lymphs Abs: 2.1 10*3/uL (ref 0.7–4.0)
MCH: 25.9 pg — ABNORMAL LOW (ref 26.0–34.0)
Neutro Abs: 4.1 10*3/uL (ref 1.7–7.7)
Neutrophils Relative %: 60 % (ref 43–77)
Platelets: 326 10*3/uL (ref 150–400)
RBC: 4.86 MIL/uL (ref 3.87–5.11)
WBC: 6.9 10*3/uL (ref 4.0–10.5)

## 2012-08-28 MED ORDER — VALACYCLOVIR HCL 500 MG PO TABS: 500.0000 mg | ORAL_TABLET | Freq: Every day | ORAL | Status: DC

## 2012-08-28 NOTE — Patient Instructions (Addendum)
Health Recommendations for Postmenopausal Women Respected and ongoing research has looked at the most common causes of death, disability, and poor quality of life in postmenopausal women. The causes include heart disease, diseases of blood vessels, diabetes, depression, cancer, and bone loss (osteoporosis). Many things can be done to help lower the chances of developing these and other common problems: CARDIOVASCULAR DISEASE Heart Disease: A heart attack is a medical emergency. Know the signs and symptoms of a heart attack. Below are things women can do to reduce their risk for heart disease.   Do not smoke. If you smoke, quit.  Aim for a healthy weight. Being overweight causes many preventable deaths. Eat a healthy and balanced diet and drink an adequate amount of liquids.  Get moving. Make a commitment to be more physically active. Aim for 30 minutes of activity on most, if not all days of the week.  Eat for heart health. Choose a diet that is low in saturated fat and cholesterol and eliminate trans fat. Include whole grains, vegetables, and fruits. Read and understand the labels on food containers before buying.  Know your numbers. Ask your caregiver to check your blood pressure, cholesterol (total, HDL, LDL, triglycerides) and blood glucose. Work with your caregiver on improving your entire clinical picture.  High blood pressure. Limit or stop your table salt intake (try salt substitute and food seasonings). Avoid salty foods and drinks. Read labels on food containers before buying. Eating well and exercising can help control high blood pressure. STROKE  Stroke is a medical emergency. Stroke may be the result of a blood clot in a blood vessel in the brain or by a brain hemorrhage (bleeding). Know the signs and symptoms of a stroke. To lower the risk of developing a stroke:  Avoid fatty foods.  Quit smoking.  Control your diabetes, blood pressure, and irregular heart rate. THROMBOPHLEBITIS  (BLOOD CLOT) OF THE LEG  Becoming overweight and leading a stationary lifestyle may also contribute to developing blood clots. Controlling your diet and exercising will help lower the risk of developing blood clots. CANCER SCREENING  Breast Cancer: Take steps to reduce your risk of breast cancer.  You should practice "breast self-awareness." This means understanding the normal appearance and feel of your breasts and should include breast self-examination. Any changes detected, no matter how small, should be reported to your caregiver.  After age 40, you should have a clinical breast exam (CBE) every year.  Starting at age 40, you should consider having a mammogram (breast X-ray) every year.  If you have a family history of breast cancer, talk to your caregiver about genetic screening.  If you are at high risk for breast cancer, talk to your caregiver about having an MRI and a mammogram every year.  Intestinal or Stomach Cancer: Tests to consider are a rectal exam, fecal occult blood, sigmoidoscopy, and colonoscopy. Women who are high risk may need to be screened at an earlier age and more often.  Cervical Cancer:  Beginning at age 30, you should have a Pap test every 3 years as long as the past 3 Pap tests have been normal.  If you have had past treatment for cervical cancer or a condition that could lead to cancer, you need Pap tests and screening for cancer for at least 20 years after your treatment.  If you had a hysterectomy for a problem that was not cancer or a condition that could lead to cancer, then you no longer need Pap tests.    If you are between ages 65 and 70, and you have had normal Pap tests going back 10 years, you no longer need Pap tests.  If Pap tests have been discontinued, risk factors (such as a new sexual partner) need to be reassessed to determine if screening should be resumed.  Some medical problems can increase the chance of getting cervical cancer. In these  cases, your caregiver may recommend more frequent screening and Pap tests.  Uterine Cancer: If you have vaginal bleeding after reaching menopause, you should notify your caregiver.  Ovarian cancer: Other than yearly pelvic exams, there are no reliable tests available to screen for ovarian cancer at this time except for yearly pelvic exams.  Lung Cancer: Yearly chest X-rays can detect lung cancer and should be done on high risk women, such as cigarette smokers and women with chronic lung disease (emphysema).  Skin Cancer: A complete body skin exam should be done at your yearly examination. Avoid overexposure to the sun and ultraviolet light lamps. Use a strong sun block cream when in the sun. All of these things are important in lowering the risk of skin cancer. MENOPAUSE Menopause Symptoms: Hormone therapy products are effective for treating symptoms associated with menopause:  Moderate to severe hot flashes.  Night sweats.  Mood swings.  Headaches.  Tiredness.  Loss of sex drive.  Insomnia.  Other symptoms. Hormone replacement carries certain risks, especially in older women. Women who use or are thinking about using estrogen or estrogen with progestin treatments should discuss that with their caregiver. Your caregiver will help you understand the benefits and risks. The ideal dose of hormone replacement therapy is not known. The Food and Drug Administration (FDA) has concluded that hormone therapy should be used only at the lowest doses and for the shortest amount of time to reach treatment goals.  OSTEOPOROSIS Protecting Against Bone Loss and Preventing Fracture: If you use hormone therapy for prevention of bone loss (osteoporosis), the risks for bone loss must outweigh the risk of the therapy. Ask your caregiver about other medications known to be safe and effective for preventing bone loss and fractures. To guard against bone loss or fractures, the following is recommended:  If  you are less than age 50, take 1000 mg of calcium and at least 600 mg of Vitamin D per day.  If you are greater than age 50 but less than age 70, take 1200 mg of calcium and at least 600 mg of Vitamin D per day.  If you are greater than age 70, take 1200 mg of calcium and at least 800 mg of Vitamin D per day. Smoking and excessive alcohol intake increases the risk of osteoporosis. Eat foods rich in calcium and vitamin D and do weight bearing exercises several times a week as your caregiver suggests. DIABETES Diabetes Melitus: If you have Type I or Type 2 diabetes, you should keep your blood sugar under control with diet, exercise and recommended medication. Avoid too many sweets, starchy and fatty foods. Being overweight can make control more difficult. COGNITION AND MEMORY Cognition and Memory: Menopausal hormone therapy is not recommended for the prevention of cognitive disorders such as Alzheimer's disease or memory loss.  DEPRESSION  Depression may occur at any age, but is common in elderly women. The reasons may be because of physical, medical, social (loneliness), or financial problems and needs. If you are experiencing depression because of medical problems and control of symptoms, talk to your caregiver about this. Physical activity and   exercise may help with mood and sleep. Community and volunteer involvement may help your sense of value and worth. If you have depression and you feel that the problem is getting worse or becoming severe, talk to your caregiver about treatment options that are best for you. ACCIDENTS  Accidents are common and can be serious in the elderly woman. Prepare your house to prevent accidents. Eliminate throw rugs, place hand bars in the bath, shower and toilet areas. Avoid wearing high heeled shoes or walking on wet, snowy, and icy areas. Limit or stop driving if you have vision or hearing problems, or you feel you are unsteady with you movements and  reflexes. HEPATITIS C Hepatitis C is a type of viral infection affecting the liver. It is spread mainly through contact with blood from an infected person. It can be treated, but if left untreated, it can lead to severe liver damage over years. Many people who are infected do not know that the virus is in their blood. If you are a "baby-boomer", it is recommended that you have one screening test for Hepatitis C. IMMUNIZATIONS  Several immunizations are important to consider having during your senior years, including:   Tetanus, diptheria, and pertussis booster shot.  Influenza every year before the flu season begins.  Pneumonia vaccine.  Shingles vaccine.  Others as indicated based on your specific needs. Talk to your caregiver about these. Document Released: 03/26/2005 Document Revised: 01/19/2012 Document Reviewed: 11/20/2007 ExitCare Patient Information 2014 [employer].  

## 2012-08-28 NOTE — Progress Notes (Signed)
Tamara Brennan 14-Sep-1960 [id number]    History:    The patient presents for annual exam.  TVH for fibroids 1996. No complaints of menopausal symptoms. Normal Pap history. HSV-2 rare outbreaks with use of Valtrex 500 2-3 doses weekly. Breast reduction 2004, normal mammograms. Negative colonoscopy 2013.   Past medical history, past surgical history, family history and social history were all reviewed and documented in the EPIC chart. 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 Parathyroidectomy 2007-Dr. Love.   ROS:  A  ROS was performed and pertinent positives and negatives are included in the history.  Exam:  Filed Vitals:   08/28/12 1034  BP: 122/72    General appearance:  Normal Head/Neck:  Normal, without cervical or supraclavicular adenopathy. Thyroid:  Symmetrical, normal in size, without palpable masses or nodularity. Respiratory  Effort:  Normal  Auscultation:  Clear without wheezing or rhonchi Cardiovascular  Auscultation:  Regular rate, without rubs, murmurs or gallops  Edema/varicosities:  Not grossly evident Abdominal  Soft,nontender, without masses, guarding or rebound.  Liver/spleen:  No organomegaly noted  Hernia:  None appreciated  Skin  Inspection:  Grossly normal  Palpation:  Grossly normal Neurologic/psychiatric  Orientation:  Normal with appropriate conversation.  Mood/affect:  Normal  Genitourinary    Breasts: Examined lying and sitting/breast reduction.     Right: Without masses, retractions, discharge or axillary adenopathy.     Left: Without masses, retractions, discharge or axillary adenopathy.   Inguinal/mons:  Normal without inguinal adenopathy  External genitalia:  Normal  BUS/Urethra/Skene's glands:  Normal  Bladder:  Normal  Vagina:  Normal  Cervix: absent  Uterus:  absent  Adnexa/parametria:     Rt: Without masses or tenderness.   Lt: Without masses or tenderness.  Anus and perineum: Normal  Digital rectal exam: Normal  sphincter tone without palpated masses or tenderness  Assessment/Plan:  52 y.o. MBF G4 P2 for annual exam with no complaints.   Perimenopausal HSV-2 obesity  Plan: Valtrex 500, continue 2-3 times weekly for suppression, prescription given. SBE's, continue annual mammogram, calcium rich diet, vitamin D 2000 daily encouraged. Continue regular exercise, and decrease calories for weight loss. CBC, glucose, lipid panel, UAHarrington Challenger WHNP, 12:43 PM 08/28/2012

## 2012-08-29 ENCOUNTER — Encounter: Payer: Self-pay | Admitting: Obstetrics and Gynecology

## 2012-08-29 LAB — URINALYSIS W MICROSCOPIC + REFLEX CULTURE
Bilirubin Urine: NEGATIVE
Casts: NONE SEEN
Glucose, UA: NEGATIVE mg/dL
Hgb urine dipstick: NEGATIVE
Leukocytes, UA: NEGATIVE
Squamous Epithelial / LPF: NONE SEEN
pH: 6 (ref 5.0–8.0)

## 2012-08-29 LAB — LIPID PANEL
Cholesterol: 173 mg/dL (ref 0–200)
HDL: 41 mg/dL (ref >39)

## 2012-08-29 LAB — GLUCOSE, RANDOM: Glucose, Bld: 84 mg/dL (ref 70–99)

## 2012-08-30 ENCOUNTER — Ambulatory Visit
Admission: RE | Admit: 2012-08-30 | Discharge: 2012-08-30 | Disposition: A | Discharge status: Home / Self Care | Payer: 59 | Source: Ambulatory Visit

## 2012-08-30 ENCOUNTER — Ambulatory Visit: Payer: 59

## 2012-08-30 DIAGNOSIS — Z1231 Encounter for screening mammogram for malignant neoplasm of breast: Secondary | ICD-10-CM

## 2012-11-01 ENCOUNTER — Ambulatory Visit
Admission: RE | Admit: 2012-11-01 | Discharge: 2012-11-01 | Disposition: A | Discharge status: Home / Self Care | Payer: 59 | Source: Ambulatory Visit | Attending: Family Medicine | Admitting: Family Medicine

## 2012-11-01 ENCOUNTER — Other Ambulatory Visit: Payer: Self-pay | Admitting: Family Medicine

## 2012-11-01 DIAGNOSIS — M25522 Pain in left elbow: Secondary | ICD-10-CM

## 2012-12-04 ENCOUNTER — Ambulatory Visit: Payer: 59 | Admitting: Podiatry

## 2012-12-21 ENCOUNTER — Other Ambulatory Visit: Payer: Self-pay

## 2013-02-12 ENCOUNTER — Encounter: Payer: Self-pay | Admitting: Podiatry

## 2013-02-12 ENCOUNTER — Ambulatory Visit (INDEPENDENT_AMBULATORY_CARE_PROVIDER_SITE_OTHER): Payer: 59 | Admitting: Podiatry

## 2013-02-12 VITALS — BP 128/72 | HR 87 | Resp 18

## 2013-02-12 DIAGNOSIS — R52 Pain, unspecified: Secondary | ICD-10-CM

## 2013-02-12 DIAGNOSIS — M659 Synovitis and tenosynovitis, unspecified: Secondary | ICD-10-CM

## 2013-02-12 NOTE — Patient Instructions (Signed)
Wear boot on right leg foot when walking. Remove when driving and sleeping. The office will contact you up on receipt of the MRI report.

## 2013-02-12 NOTE — Progress Notes (Signed)
° °  Subjective:    Patient ID: Tamara Brennan, female    DOB: 1960-06-09, 52 y.o.   MRN: [id number]  HPI  I was here in august of this year for my right ankle and it eventually got worse and the brace I could not wear, it really didn't help any, tender and sore during the day and still hurts me in the morning and is swollen and hard feeling and hurts on the inside of my right ankle  Patient was last evaluated in 09/27/2012 and an ankle stabilizer was dispensed. Patient wore the ankle stabilizer for a brief period time however the pain persisted.  Review of Systems  Constitutional: Negative.   HENT: Negative.   Eyes: Negative.   Respiratory: Negative.   Cardiovascular: Negative.   Gastrointestinal: Negative.   Endocrine: Negative.   Genitourinary: Negative.   Musculoskeletal: Positive for back pain.  Skin: Negative.   Allergic/Immunologic: Negative.   Neurological: Negative.   Hematological: Negative.   Psychiatric/Behavioral: Negative.        Objective:   Physical Exam  52 year old female orientated x3  Vascular: DP and PT pulses are two over four bilaterally.  Neurological: Knee and ankle reflexes equal and reactive bilaterally  Dermatological: Low-grade edema over medial right ankle noted  Musculoskeletal: Patient is not able to heel off unilaterally on right foot. Is able to heel off easily on left. There is palpable tenderness over the retro-medial malleolar area right.      Assessment & Plan:   Assessment: Medial ankle tendinopathy right  Plan: Patient placed in Cam Walker boot with instructions to wear except when showering, sleeping and driving. She is referred for a MRI image of the right ankle without contrast for the indication of persistent pain and swelling over the right medial ankle and has persisted since approximately July 2014.

## 2013-02-21 ENCOUNTER — Ambulatory Visit
Admission: RE | Admit: 2013-02-21 | Discharge: 2013-02-21 | Disposition: A | Discharge status: Home / Self Care | Payer: 59 | Source: Ambulatory Visit | Attending: Podiatry | Admitting: Podiatry

## 2013-02-21 DIAGNOSIS — R52 Pain, unspecified: Secondary | ICD-10-CM

## 2013-02-26 ENCOUNTER — Telehealth: Payer: Self-pay | Admitting: *Deleted

## 2013-02-26 NOTE — Telephone Encounter (Signed)
Patient called on Friday inquiring about the results of her MRI at 4:35pm.  I returned her call and informed her that Dr. Amalia Hailey has not reviewed the results.  We will call her with a response as soon as he reviews it.

## 2013-02-26 NOTE — Telephone Encounter (Signed)
Patient called inquiring about MRI results.  I informed her he's not in the office today.  He will be in on Wednesday at which time he'll review the results and we'll give her a call then.

## 2013-02-28 ENCOUNTER — Telehealth: Payer: Self-pay | Admitting: *Deleted

## 2013-02-28 NOTE — Telephone Encounter (Signed)
Per Dr. Amalia Hailey I called and informed the patient that he reviewed her MRI.  I advised her to wear air fracture walker for 6-8weeks.  I informed her he wants to see her to go over the results.  Patient was transferred to a scheduler for an appointment.

## 2013-02-28 NOTE — Telephone Encounter (Signed)
Message copied by Lolita Rieger on Wed Feb 28, 2013  4:50 PM ------      Message from: Clovis Riley E      Created: Wed Feb 28, 2013  4:41 PM                   ----- Message -----         From: Kendell Bane, DPM         Sent: 02/28/2013   8:24 AM           To: Andres Ege, RN            Notify patient that she needs to continue to wear her Cam Walker boot on a continuous basis for 6-8 weeksan inflamed tendon. Schedule patient for a consult review MRI image.            Marland KitchenIMPRESSION:      1. Significant posterior tibialis tendinopathy and tenosynovitis      likely accounting for the patient's medial ankle pain.      2. Mild ankle and midfoot degenerative changes.      3. No stress fracture or osteochondral lesions.      4. Intact medial and lateral ankle ligaments.       ------

## 2013-03-05 ENCOUNTER — Ambulatory Visit (INDEPENDENT_AMBULATORY_CARE_PROVIDER_SITE_OTHER): Payer: 59 | Admitting: Podiatry

## 2013-03-05 ENCOUNTER — Encounter: Payer: Self-pay | Admitting: Podiatry

## 2013-03-05 VITALS — BP 113/71 | HR 109 | Resp 18

## 2013-03-05 DIAGNOSIS — M659 Synovitis and tenosynovitis, unspecified: Secondary | ICD-10-CM

## 2013-03-05 NOTE — Progress Notes (Signed)
   Subjective:    Patient ID: Tamara Brennan, female    DOB: 06/21/1960, 53 y.o.   MRN: [id number]  HPI It still bothers me when I sit and try to get back up and I am here for my MRI results Patient presents with her husband to review MRI report of the right ankle on 02/21/2013..She presented in July 2014 complaining of medial right ankle pain and at that time an ankle stabilizer was dispensed which patient wore for a brief period of time. The medial right pain persisted. She returned on 02/13/2013 again complaining of painful medial right ankle. At that time MRI image noncontrast of the right ankle was ordered.   Review of Systems     Objective:   Physical Exam  Patient presents without wearing the Cam Walker boot which I instruct her to wear after the initial review of the MRI image to   Status: Signed        Per Dr. Amalia Hailey I called and informed the patient that he reviewed her MRI. I advised her to wear air fracture walker for 6-8weeks. I informed her he wants to see her to go over the results. Patient was transferred to a scheduler for an appointment.  Message dated 02/28/2013  Musculoskeletal: Mild edema medial right ankle noted with palpable tenderness in the retro-medial right ankle area.  MRI results of right ankle 02/21/2013  Significant posterior tibialis tendinopathy and tenosynovitis likely accounting for the patient's medial ankle pain. Mild ankle and midfoot degenerative changes No stress fracture or osteochondral lesions Intact medial and lateral ankle ligaments        Assessment & Plan:    Assessment: See below MRI results for definitive diagnosis. MRI results of right ankle 02/21/2013  Significant posterior tibialis tendinopathy and tenosynovitis likely accounting for the patient's medial ankle pain. Mild ankle and midfoot degenerative changes No stress fracture or osteochondral lesions Intact medial and lateral ankle ligaments Noncompliance the patient  in regards to wearing a Cam Walker boot consistently on the right lower extremity.  Plan: Today I had a detailed discussion with patient with her husband present. 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 I informed her that immobilization may be able to resolve the problem however, surgical intervention is a possibility. Patient was advised that she must continuously wear the Cam Walker boot on a daily basis with the exception of driving, showering, and sleeping. Patient admits that she has not consistent and wearing a Cam Walker boot.  In addition, I advised patient to remove the boot in a seated position and dorsi and plantarflex right foot for 2-3 minutes several times a day.  Reappoint x6 weeks

## 2013-03-05 NOTE — Patient Instructions (Signed)
Wear Cam Walker boot on right leg except when showering and driving. Remove boot in a seated position 3 times a day and move foot up and down to make calf muscle work.

## 2013-04-16 ENCOUNTER — Ambulatory Visit (INDEPENDENT_AMBULATORY_CARE_PROVIDER_SITE_OTHER): Payer: 59 | Admitting: Podiatry

## 2013-04-16 ENCOUNTER — Encounter: Payer: Self-pay | Admitting: Podiatry

## 2013-04-16 VITALS — BP 127/90 | HR 90 | Resp 18

## 2013-04-16 DIAGNOSIS — M659 Synovitis and tenosynovitis, unspecified: Secondary | ICD-10-CM

## 2013-04-16 NOTE — Patient Instructions (Signed)
Continue to wear boot on right leg at all times when walking an additional 6 weeks.

## 2013-04-16 NOTE — Progress Notes (Signed)
° °  Subjective:    Patient ID: Tamara Brennan, female    DOB: June 13, 1960, 53 y.o.   MRN: [id number]  HPI  I wear my boot all day long except when I drive and the only time it hurts is if I am laying down and when I get up in the morning it is very painful on my right foot.  She presented in July 2014 complaining of medial right ankle pain and at that time an ankle stabilizer was dispensed which patient wore for a brief period of time. The medial right pain persisted. She returned on 02/13/2013 again complaining of painful medial right ankle. At that time MRI image noncontrast of the right ankle was ordered. Patient is consistently wearing Cam Walker boot on the right leg since the visit of 03/06/2013. The pain is significantly improved in the right ankle area approximately 50%. Which he does not wear the boot the pain tends to recur.   Review of Systems     Objective:   Physical Exam  Orientated x3 black female  The medial right ankle demonstrate no edema. Patient is able to heel off unilaterally and bilaterally.  MRI results of right ankle 02/21/2013  Significant posterior tibialis tendinopathy and tenosynovitis likely accounting for the patient's medial ankle pain.  Mild ankle and midfoot degenerative changes  No stress fracture or osteochondral lesions  Intact medial and lateral ankle ligaments         Assessment & Plan:   Assessment: Improving symptoms of posterior tibial tendinopathy  Plan: Advised patient to continue wearing Cam Walker boot on a consistent basis on the right leg an additional 6 weeks.  Reappoint x6 weeks

## 2013-05-28 ENCOUNTER — Ambulatory Visit: Payer: 59 | Admitting: Podiatry

## 2013-06-18 ENCOUNTER — Encounter: Payer: Self-pay | Admitting: Podiatry

## 2013-06-18 ENCOUNTER — Ambulatory Visit (INDEPENDENT_AMBULATORY_CARE_PROVIDER_SITE_OTHER): Payer: 59 | Admitting: Podiatry

## 2013-06-18 VITALS — BP 118/51 | HR 103 | Resp 18

## 2013-06-18 DIAGNOSIS — M659 Synovitis and tenosynovitis, unspecified: Secondary | ICD-10-CM

## 2013-06-18 NOTE — Patient Instructions (Signed)
We're the ankle stabilizer on the right ankle daily until all ankle/foot pain ends.

## 2013-06-19 ENCOUNTER — Encounter: Payer: Self-pay | Admitting: Podiatry

## 2013-06-19 NOTE — Progress Notes (Signed)
Patient ID: Tamara Brennan, female   DOB: 01/17/1961, 53 y.o.   MRN: [id number]  Subjective:   She presented in July 2014 complaining of medial right ankle pain and at that time an ankle stabilizer was dispensed which patient wore for a brief period of time. The medial right pain persisted. She returned on 02/13/2013 again complaining of painful medial right ankle. At that time MRI image noncontrast of the right ankle was ordered.  Patient is consistently wearing Cam Walker boot on the right leg since the visit of 03/06/2013. The pain is significantly improved in the right ankle area approximately  more than 50%. Which he does not wear the boot the pain tends to recur, however, the pain is continued to reduce over time  Objective: MRI results of right ankle 02/21/2013  Significant posterior tibialis tendinopathy and tenosynovitis likely accounting for the patient's medial ankle pain.  Mild ankle and midfoot degenerative changes  No stress fracture or osteochondral lesions  Intact medial and lateral ankle ligaments    There is no erythema noted over the medial right ankle/foot area. Patient is able to weakly heel off on the right There is no evidence of too may toe signs on the right. Patient is able to heel off on the left  Assessment: Reducing posterior tibial tenosynovitis right  Plan: DC Cam Walker boot Patient advised to wear her existing ankle stabilizer on the right ankle on a continuous basis until all pain has subsided.  Reappoint at patient's request

## 2013-07-25 ENCOUNTER — Other Ambulatory Visit: Payer: Self-pay

## 2013-07-25 DIAGNOSIS — Z1231 Encounter for screening mammogram for malignant neoplasm of breast: Secondary | ICD-10-CM

## 2013-08-29 ENCOUNTER — Ambulatory Visit (INDEPENDENT_AMBULATORY_CARE_PROVIDER_SITE_OTHER): Payer: 59 | Admitting: Women's Health

## 2013-08-29 ENCOUNTER — Encounter: Payer: Self-pay | Admitting: Women's Health

## 2013-08-29 VITALS — BP 120/78 | Ht 68.0 in | Wt 216.0 lb

## 2013-08-29 DIAGNOSIS — B009 Herpesviral infection, unspecified: Secondary | ICD-10-CM

## 2013-08-29 DIAGNOSIS — Z01419 Encounter for gynecological examination (general) (routine) without abnormal findings: Secondary | ICD-10-CM

## 2013-08-29 DIAGNOSIS — Z13220 Encounter for screening for lipoid disorders: Secondary | ICD-10-CM

## 2013-08-29 LAB — CBC WITH DIFFERENTIAL/PLATELET
BASOS ABS: 0 10*3/uL (ref 0.0–0.1)
BASOS PCT: 0 % (ref 0–1)
EOS ABS: 0.2 10*3/uL (ref 0.0–0.7)
Eosinophils Relative: 3 % (ref 0–5)
HCT: 37 % (ref 36.0–46.0)
HEMOGLOBIN: 12.8 g/dL (ref 12.0–15.0)
Lymphocytes Relative: 32 % (ref 12–46)
Lymphs Abs: 2.1 10*3/uL (ref 0.7–4.0)
MCH: 26.7 pg (ref 26.0–34.0)
MCHC: 34.6 g/dL (ref 30.0–36.0)
MCV: 77.2 fL — ABNORMAL LOW (ref 78.0–100.0)
MONO ABS: 0.5 10*3/uL (ref 0.1–1.0)
MONOS PCT: 7 % (ref 3–12)
NEUTROS ABS: 3.8 10*3/uL (ref 1.7–7.7)
Neutrophils Relative %: 58 % (ref 43–77)
Platelets: 311 10*3/uL (ref 150–400)
RBC: 4.79 MIL/uL (ref 3.87–5.11)
RDW: 14.3 % (ref 11.5–15.5)
WBC: 6.6 10*3/uL (ref 4.0–10.5)

## 2013-08-29 LAB — LIPID PANEL
CHOL/HDL RATIO: 4.5 ratio
Cholesterol: 174 mg/dL (ref 0–200)
HDL: 39 mg/dL — AB (ref >39)
LDL CALC: 107 mg/dL — AB (ref 0–99)
TRIGLYCERIDES: 139 mg/dL (ref <150)
VLDL: 28 mg/dL (ref 0–40)

## 2013-08-29 LAB — COMPREHENSIVE METABOLIC PANEL
ALBUMIN: 4.6 g/dL (ref 3.5–5.2)
ALK PHOS: 53 U/L (ref 39–117)
ALT: 38 U/L — ABNORMAL HIGH (ref 0–35)
AST: 27 U/L (ref 0–37)
BUN: 12 mg/dL (ref 6–23)
CO2: 25 mEq/L (ref 19–32)
Calcium: 9.4 mg/dL (ref 8.4–10.5)
Chloride: 103 mEq/L (ref 96–112)
Creat: 1.02 mg/dL (ref 0.50–1.10)
Glucose, Bld: 93 mg/dL (ref 70–99)
POTASSIUM: 4.3 meq/L (ref 3.5–5.3)
Sodium: 138 mEq/L (ref 135–145)
Total Bilirubin: 0.5 mg/dL (ref 0.2–1.2)
Total Protein: 7.3 g/dL (ref 6.0–8.3)

## 2013-08-29 MED ORDER — VALACYCLOVIR HCL 500 MG PO TABS: 500.0000 mg | ORAL_TABLET | Freq: Every day | ORAL | Status: DC

## 2013-08-29 NOTE — Progress Notes (Signed)
Tamara Brennan 1960/10/30 [id number]    History:    Presents for annual exam.  1996 TVH for fibroids. HSV II with rare outbreaks. Normal Pap and mammogram history. Occasional hot flash, some difficulty with sleep. Negative colonoscopy 2013.  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 2007 parathyroidectomy.  ROS:  A  12 point ROS was performed and pertinent positives and negatives are included.  Exam:  Filed Vitals:   08/29/13 1038  BP: 120/78    General appearance:  Normal Thyroid:  Symmetrical, normal in size, without palpable masses or nodularity. Respiratory  Auscultation:  Clear without wheezing or rhonchi Cardiovascular  Auscultation:  Regular rate, without rubs, murmurs or gallops  Edema/varicosities:  Not grossly evident Abdominal  Soft,nontender, without masses, guarding or rebound.  Liver/spleen:  No organomegaly noted  Hernia:  None appreciated  Skin  Inspection:  Grossly normal   Breasts: Examined lying and sitting/bilateral reduction.     Right: Without masses, retractions, discharge or axillary adenopathy.     Left: Without masses, retractions, discharge or axillary adenopathy. Gentitourinary   Inguinal/mons:  Normal without inguinal adenopathy  External genitalia:  Normal  BUS/Urethra/Skene's glands:  Normal  Vagina:  Normal  Cervix: Absent  Uterus:  Absent             Adnexa/parametria:     Rt: Without masses or tenderness.   Lt: Without masses or tenderness.  Anus and perineum: Normal  Digital rectal exam: Normal sphincter tone without palpated masses or tenderness  Assessment/Plan:  53 y.o. MBF G4P2 for annual exam.     HSV Occasional vasomotor symptoms  Plan: SBE's, continue annual mammogram, calcium rich diet, vitamin D 2000 daily encouraged. Reviewed importance of continuing regular exercise. Valtrex 500 twice daily for  3-5 days as needed, prescription, proper use given and reviewed. Menopause reviewed, declines HRT. CBC, comprehensive metabolic panel, lipid panel, UA,   Note: This dictation was prepared with Dragon/digital dictation.  Any transcriptional errors that result are unintentional. Huel Cote [facility], 11:24 AM 08/29/2013

## 2013-08-29 NOTE — Patient Instructions (Signed)
Health Recommendations for Postmenopausal Women Respected and ongoing research has looked at the most common causes of death, disability, and poor quality of life in postmenopausal women. The causes include heart disease, diseases of blood vessels, diabetes, depression, cancer, and bone loss (osteoporosis). Many things can be done to help lower the chances of developing these and other common problems: CARDIOVASCULAR DISEASE Heart Disease: A heart attack is a medical emergency. Know the signs and symptoms of a heart attack. Below are things women can do to reduce their risk for heart disease.   Do not smoke. If you smoke, quit.  Aim for a healthy weight. Being overweight causes many preventable deaths. Eat a healthy and balanced diet and drink an adequate amount of liquids.  Get moving. Make a commitment to be more physically active. Aim for 30 minutes of activity on most, if not all days of the week.  Eat for heart health. Choose a diet that is low in saturated fat and cholesterol and eliminate trans fat. Include whole grains, vegetables, and fruits. Read and understand the labels on food containers before buying.  Know your numbers. Ask your caregiver to check your blood pressure, cholesterol (total, HDL, LDL, triglycerides) and blood glucose. Work with your caregiver on improving your entire clinical picture.  High blood pressure. Limit or stop your table salt intake (try salt substitute and food seasonings). Avoid salty foods and drinks. Read labels on food containers before buying. Eating well and exercising can help control high blood pressure. STROKE  Stroke is a medical emergency. Stroke may be the result of a blood clot in a blood vessel in the brain or by a brain hemorrhage (bleeding). Know the signs and symptoms of a stroke. To lower the risk of developing a stroke:  Avoid fatty foods.  Quit smoking.  Control your diabetes, blood pressure, and irregular heart rate. THROMBOPHLEBITIS  (BLOOD CLOT) OF THE LEG  Becoming overweight and leading a stationary lifestyle may also contribute to developing blood clots. Controlling your diet and exercising will help lower the risk of developing blood clots. CANCER SCREENING  Breast Cancer: Take steps to reduce your risk of breast cancer.  You should practice "breast self-awareness." This means understanding the normal appearance and feel of your breasts and should include breast self-examination. Any changes detected, no matter how small, should be reported to your caregiver.  After age 40, you should have a clinical breast exam (CBE) every year.  Starting at age 40, you should consider having a mammogram (breast X-ray) every year.  If you have a family history of breast cancer, talk to your caregiver about genetic screening.  If you are at high risk for breast cancer, talk to your caregiver about having an MRI and a mammogram every year.  Intestinal or Stomach Cancer: Tests to consider are a rectal exam, fecal occult blood, sigmoidoscopy, and colonoscopy. Women who are high risk may need to be screened at an earlier age and more often.  Cervical Cancer:  Beginning at age 30, you should have a Pap test every 3 years as long as the past 3 Pap tests have been normal.  If you have had past treatment for cervical cancer or a condition that could lead to cancer, you need Pap tests and screening for cancer for at least 20 years after your treatment.  If you had a hysterectomy for a problem that was not cancer or a condition that could lead to cancer, then you no longer need Pap tests.    If you are between ages 65 and 70, and you have had normal Pap tests going back 10 years, you no longer need Pap tests.  If Pap tests have been discontinued, risk factors (such as a new sexual partner) need to be reassessed to determine if screening should be resumed.  Some medical problems can increase the chance of getting cervical cancer. In these  cases, your caregiver may recommend more frequent screening and Pap tests.  Uterine Cancer: If you have vaginal bleeding after reaching menopause, you should notify your caregiver.  Ovarian cancer: Other than yearly pelvic exams, there are no reliable tests available to screen for ovarian cancer at this time except for yearly pelvic exams.  Lung Cancer: Yearly chest X-rays can detect lung cancer and should be done on high risk women, such as cigarette smokers and women with chronic lung disease (emphysema).  Skin Cancer: A complete body skin exam should be done at your yearly examination. Avoid overexposure to the sun and ultraviolet light lamps. Use a strong sun block cream when in the sun. All of these things are important in lowering the risk of skin cancer. MENOPAUSE Menopause Symptoms: Hormone therapy products are effective for treating symptoms associated with menopause:  Moderate to severe hot flashes.  Night sweats.  Mood swings.  Headaches.  Tiredness.  Loss of sex drive.  Insomnia.  Other symptoms. Hormone replacement carries certain risks, especially in older women. Women who use or are thinking about using estrogen or estrogen with progestin treatments should discuss that with their caregiver. Your caregiver will help you understand the benefits and risks. The ideal dose of hormone replacement therapy is not known. The Food and Drug Administration (FDA) has concluded that hormone therapy should be used only at the lowest doses and for the shortest amount of time to reach treatment goals.  OSTEOPOROSIS Protecting Against Bone Loss and Preventing Fracture: If you use hormone therapy for prevention of bone loss (osteoporosis), the risks for bone loss must outweigh the risk of the therapy. Ask your caregiver about other medications known to be safe and effective for preventing bone loss and fractures. To guard against bone loss or fractures, the following is recommended:  If  you are less than age 50, take 1000 mg of calcium and at least 600 mg of Vitamin D per day.  If you are greater than age 50 but less than age 70, take 1200 mg of calcium and at least 600 mg of Vitamin D per day.  If you are greater than age 70, take 1200 mg of calcium and at least 800 mg of Vitamin D per day. Smoking and excessive alcohol intake increases the risk of osteoporosis. Eat foods rich in calcium and vitamin D and do weight bearing exercises several times a week as your caregiver suggests. DIABETES Diabetes Melitus: If you have Type I or Type 2 diabetes, you should keep your blood sugar under control with diet, exercise and recommended medication. Avoid too many sweets, starchy and fatty foods. Being overweight can make control more difficult. COGNITION AND MEMORY Cognition and Memory: Menopausal hormone therapy is not recommended for the prevention of cognitive disorders such as Alzheimer's disease or memory loss.  DEPRESSION  Depression may occur at any age, but is common in elderly women. The reasons may be because of physical, medical, social (loneliness), or financial problems and needs. If you are experiencing depression because of medical problems and control of symptoms, talk to your caregiver about this. Physical activity and   exercise may help with mood and sleep. Community and volunteer involvement may help your sense of value and worth. If you have depression and you feel that the problem is getting worse or becoming severe, talk to your caregiver about treatment options that are best for you. ACCIDENTS  Accidents are common and can be serious in the elderly woman. Prepare your house to prevent accidents. Eliminate throw rugs, place hand bars in the bath, shower and toilet areas. Avoid wearing high heeled shoes or walking on wet, snowy, and icy areas. Limit or stop driving if you have vision or hearing problems, or you feel you are unsteady with you movements and  reflexes. HEPATITIS C Hepatitis C is a type of viral infection affecting the liver. It is spread mainly through contact with blood from an infected person. It can be treated, but if left untreated, it can lead to severe liver damage over years. Many people who are infected do not know that the virus is in their blood. If you are a "baby-boomer", it is recommended that you have one screening test for Hepatitis C. IMMUNIZATIONS  Several immunizations are important to consider having during your senior years, including:   Tetanus, diptheria, and pertussis booster shot.  Influenza every year before the flu season begins.  Pneumonia vaccine.  Shingles vaccine.  Others as indicated based on your specific needs. Talk to your caregiver about these. Document Released: 03/26/2005 Document Revised: 01/19/2012 Document Reviewed: 11/20/2007 [facility] Patient Information 2015 [place], [place]. This information is not intended to replace advice given to you by your health care provider. Make sure you discuss any questions you have with your health care provider.

## 2013-08-30 ENCOUNTER — Encounter: Payer: Self-pay | Admitting: Women's Health

## 2013-08-30 LAB — URINALYSIS W MICROSCOPIC + REFLEX CULTURE
Bacteria, UA: NONE SEEN
Bilirubin Urine: NEGATIVE
CRYSTALS: NONE SEEN
Casts: NONE SEEN
Glucose, UA: NEGATIVE mg/dL
Hgb urine dipstick: NEGATIVE
Ketones, ur: NEGATIVE mg/dL
LEUKOCYTES UA: NEGATIVE
NITRITE: NEGATIVE
PROTEIN: NEGATIVE mg/dL
SPECIFIC GRAVITY, URINE: 1.017 (ref 1.005–1.030)
SQUAMOUS EPITHELIAL / LPF: NONE SEEN
UROBILINOGEN UA: 0.2 mg/dL (ref 0.0–1.0)
pH: 5 (ref 5.0–8.0)

## 2013-08-31 ENCOUNTER — Ambulatory Visit
Admission: RE | Admit: 2013-08-31 | Discharge: 2013-08-31 | Disposition: A | Discharge status: Home / Self Care | Payer: 59 | Source: Ambulatory Visit

## 2013-08-31 DIAGNOSIS — Z1231 Encounter for screening mammogram for malignant neoplasm of breast: Secondary | ICD-10-CM

## 2013-09-03 ENCOUNTER — Encounter: Payer: Self-pay | Admitting: Women's Health

## 2013-12-17 ENCOUNTER — Encounter: Payer: Self-pay | Admitting: Women's Health

## 2014-07-22 ENCOUNTER — Other Ambulatory Visit: Payer: Self-pay

## 2014-07-22 DIAGNOSIS — Z1231 Encounter for screening mammogram for malignant neoplasm of breast: Secondary | ICD-10-CM

## 2014-08-14 ENCOUNTER — Encounter: Payer: Self-pay | Admitting: Vascular Surgery

## 2014-08-20 ENCOUNTER — Encounter (INDEPENDENT_AMBULATORY_CARE_PROVIDER_SITE_OTHER): Payer: Commercial Managed Care - HMO

## 2014-08-20 DIAGNOSIS — M7989 Other specified soft tissue disorders: Secondary | ICD-10-CM

## 2014-09-02 ENCOUNTER — Ambulatory Visit
Admission: RE | Admit: 2014-09-02 | Discharge: 2014-09-02 | Disposition: A | Discharge status: Home / Self Care | Payer: Commercial Managed Care - HMO | Source: Ambulatory Visit

## 2014-09-02 DIAGNOSIS — Z1231 Encounter for screening mammogram for malignant neoplasm of breast: Secondary | ICD-10-CM

## 2014-09-04 ENCOUNTER — Encounter: Payer: Self-pay | Admitting: Women's Health

## 2014-09-04 ENCOUNTER — Ambulatory Visit (INDEPENDENT_AMBULATORY_CARE_PROVIDER_SITE_OTHER): Payer: Commercial Managed Care - HMO | Admitting: Women's Health

## 2014-09-04 VITALS — BP 118/70 | Ht 68.0 in | Wt 213.0 lb

## 2014-09-04 DIAGNOSIS — Z90710 Acquired absence of both cervix and uterus: Secondary | ICD-10-CM

## 2014-09-04 DIAGNOSIS — Z01419 Encounter for gynecological examination (general) (routine) without abnormal findings: Secondary | ICD-10-CM

## 2014-09-04 DIAGNOSIS — B009 Herpesviral infection, unspecified: Secondary | ICD-10-CM | POA: Diagnosis not present

## 2014-09-04 MED ORDER — VALACYCLOVIR HCL 500 MG PO TABS: 500.0000 mg | ORAL_TABLET | Freq: Every day | ORAL | Status: DC

## 2014-09-04 MED ORDER — BETAMETHASONE VALERATE 0.1 % EX OINT
1.0000 | TOPICAL_OINTMENT | Freq: Two times a day (BID) | CUTANEOUS | Status: DC
Start: 2014-09-04 — End: 2019-11-08

## 2014-09-04 NOTE — Progress Notes (Signed)
[name] Jan 22, 1961 [id number]    History:    Presents for annual exam.  1996 TVH for fibroids on no HRT. HSV rare outbreaks on Valtrex 500 daily. Normal Pap and mammogram history. 2013 negative colonoscopy. 2007 Parathyroidectomy. Primary care manages labs.  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  ROS:  A ROS was performed and pertinent positives and negatives are included.  Exam:  Filed Vitals:   09/04/14 0951  BP: 118/70    General appearance:  Normal Thyroid:  Symmetrical, normal in size, without palpable masses or nodularity. Respiratory  Auscultation:  Clear without wheezing or rhonchi Cardiovascular  Auscultation:  Regular rate, without rubs, murmurs or gallops  Edema/varicosities:  Not grossly evident Abdominal  Soft,nontender, without masses, guarding or rebound.  Liver/spleen:  No organomegaly noted  Hernia:  None appreciated  Skin  Inspection:  Grossly normal, superficial 4 cm of erythematous dry patch   Breasts: Examined lying and sitting.     Right: Without masses, retractions, discharge or axillary adenopathy.     Left: Without masses, retractions, discharge or axillary adenopathy. Gentitourinary   Inguinal/mons:  Normal without inguinal adenopathy  External genitalia:  Normal  BUS/Urethra/Skene's glands:  Normal  Vagina:  Normal  Cervix:  And uterus absent  Adnexa/parametria:     Rt: Without masses or tenderness.   Lt: Without masses or tenderness.  Anus and perineum: Normal  Digital rectal exam: Normal sphincter tone without palpated masses or tenderness  Assessment/Plan:  54 y.o. MBF G4 P2 for annual exam with no complaints.   1996 TVH for fibroids HSV on Valtrex suppression Primary care-labs  Plan: SBE's, continue annual screening mammogram, calcium rich diet, vitamin D 2000 daily encouraged. Importance of  regular exercise, decreasing calories for weight loss, calcium rich foods encouraged. UA. Valtrex 500 by mouth daily prescription, proper use given and reviewed. Valisone 0.5% ointment twice daily for mild rash on chest. Dermatologist if no relief.  Huel Cote [facility], 10:36 AM 09/04/2014

## 2014-09-04 NOTE — Patient Instructions (Signed)
Health Recommendations for Postmenopausal Women Respected and ongoing research has looked at the most common causes of death, disability, and poor quality of life in postmenopausal women. The causes include heart disease, diseases of blood vessels, diabetes, depression, cancer, and bone loss (osteoporosis). Many things can be done to help lower the chances of developing these and other common problems. CARDIOVASCULAR DISEASE Heart Disease: A heart attack is a medical emergency. Know the signs and symptoms of a heart attack. Below are things women can do to reduce their risk for heart disease.   Do not smoke. If you smoke, quit.  Aim for a healthy weight. Being overweight causes many preventable deaths. Eat a healthy and balanced diet and drink an adequate amount of liquids.  Get moving. Make a commitment to be more physically active. Aim for 30 minutes of activity on most, if not all days of the week.  Eat for heart health. Choose a diet that is low in saturated fat and cholesterol and eliminate trans fat. Include whole grains, vegetables, and fruits. Read and understand the labels on food containers before buying.  Know your numbers. Ask your caregiver to check your blood pressure, cholesterol (total, HDL, LDL, triglycerides) and blood glucose. Work with your caregiver on improving your entire clinical picture.  High blood pressure. Limit or stop your table salt intake (try salt substitute and food seasonings). Avoid salty foods and drinks. Read labels on food containers before buying. Eating well and exercising can help control high blood pressure. STROKE  Stroke is a medical emergency. Stroke may be the result of a blood clot in a blood vessel in the brain or by a brain hemorrhage (bleeding). Know the signs and symptoms of a stroke. To lower the risk of developing a stroke:  Avoid fatty foods.  Quit smoking.  Control your diabetes, blood pressure, and irregular heart rate. THROMBOPHLEBITIS  (BLOOD CLOT) OF THE LEG  Becoming overweight and leading a stationary lifestyle may also contribute to developing blood clots. Controlling your diet and exercising will help lower the risk of developing blood clots. CANCER SCREENING  Breast Cancer: Take steps to reduce your risk of breast cancer.  You should practice "breast self-awareness." This means understanding the normal appearance and feel of your breasts and should include breast self-examination. Any changes detected, no matter how small, should be reported to your caregiver.  After age 54, you should have a clinical breast exam (CBE) every year.  Starting at age 54, you should consider having a mammogram (breast X-ray) every year.  If you have a family history of breast cancer, talk to your caregiver about genetic screening.  If you are at high risk for breast cancer, talk to your caregiver about having an MRI and a mammogram every year.  Intestinal or Stomach Cancer: Tests to consider are a rectal exam, fecal occult blood, sigmoidoscopy, and colonoscopy. Women who are high risk may need to be screened at an earlier age and more often.  Cervical Cancer:  Beginning at age 54, you should have a Pap test every 3 years as long as the past 3 Pap tests have been normal.  If you have had past treatment for cervical cancer or a condition that could lead to cancer, you need Pap tests and screening for cancer for at least 20 years after your treatment.  If you had a hysterectomy for a problem that was not cancer or a condition that could lead to cancer, then you no longer need Pap tests.    If you are between ages 54 and 70, and you have had normal Pap tests going back 10 years, you no longer need Pap tests.  If Pap tests have been discontinued, risk factors (such as a new sexual partner) need to be reassessed to determine if screening should be resumed.  Some medical problems can increase the chance of getting cervical cancer. In these  cases, your caregiver may recommend more frequent screening and Pap tests.  Uterine Cancer: If you have vaginal bleeding after reaching menopause, you should notify your caregiver.  Ovarian Cancer: Other than yearly pelvic exams, there are no reliable tests available to screen for ovarian cancer at this time except for yearly pelvic exams.  Lung Cancer: Yearly chest X-rays can detect lung cancer and should be done on high risk women, such as cigarette smokers and women with chronic lung disease (emphysema).  Skin Cancer: A complete body skin exam should be done at your yearly examination. Avoid overexposure to the sun and ultraviolet light lamps. Use a strong sun block cream when in the sun. All of these things are important for lowering the risk of skin cancer. MENOPAUSE Menopause Symptoms: Hormone therapy products are effective for treating symptoms associated with menopause:  Moderate to severe hot flashes.  Night sweats.  Mood swings.  Headaches.  Tiredness.  Loss of sex drive.  Insomnia.  Other symptoms. Hormone replacement carries certain risks, especially in older women. Women who use or are thinking about using estrogen or estrogen with progestin treatments should discuss that with their caregiver. Your caregiver will help you understand the benefits and risks. The ideal dose of hormone replacement therapy is not known. The Food and Drug Administration (FDA) has concluded that hormone therapy should be used only at the lowest doses and for the shortest amount of time to reach treatment goals.  OSTEOPOROSIS Protecting Against Bone Loss and Preventing Fracture If you use hormone therapy for prevention of bone loss (osteoporosis), the risks for bone loss must outweigh the risk of the therapy. Ask your caregiver about other medications known to be safe and effective for preventing bone loss and fractures. To guard against bone loss or fractures, the following is recommended:  If  you are younger than age 50, take 1000 mg of calcium and at least 600 mg of Vitamin D per day.  If you are older than age 50 but younger than age 70, take 1200 mg of calcium and at least 600 mg of Vitamin D per day.  If you are older than age 70, take 1200 mg of calcium and at least 800 mg of Vitamin D per day. Smoking and excessive alcohol intake increases the risk of osteoporosis. Eat foods rich in calcium and vitamin D and do weight bearing exercises several times a week as your caregiver suggests. DIABETES Diabetes Mellitus: If you have type I or type 2 diabetes, you should keep your blood sugar under control with diet, exercise, and recommended medication. Avoid starchy and fatty foods, and too many sweets. Being overweight can make diabetes control more difficult. COGNITION AND MEMORY Cognition and Memory: Menopausal hormone therapy is not recommended for the prevention of cognitive disorders such as Alzheimer's disease or memory loss.  DEPRESSION  Depression may occur at any age, but it is common in elderly women. This may be because of physical, medical, social (loneliness), or financial problems and needs. If you are experiencing depression because of medical problems and control of symptoms, talk to your caregiver about this. Physical   activity and exercise may help with mood and sleep. Community and volunteer involvement may improve your sense of value and worth. If you have depression and you feel that the problem is getting worse or becoming severe, talk to your caregiver about which treatment options are best for you. ACCIDENTS  Accidents are common and can be serious in elderly woman. Prepare your house to prevent accidents. Eliminate throw rugs, place hand bars in bath, shower, and toilet areas. Avoid wearing high heeled shoes or walking on wet, snowy, and icy areas. Limit or stop driving if you have vision or hearing problems, or if you feel you are unsteady with your movements and  reflexes. HEPATITIS C Hepatitis C is a type of viral infection affecting the liver. It is spread mainly through contact with blood from an infected person. It can be treated, but if left untreated, it can lead to severe liver damage over the years. Many people who are infected do not know that the virus is in their blood. If you are a "baby-boomer", it is recommended that you have one screening test for Hepatitis C. IMMUNIZATIONS  Several immunizations are important to consider having during your senior years, including:   Tetanus, diphtheria, and pertussis booster shot.  Influenza every year before the flu season begins.  Pneumonia vaccine.  Shingles vaccine.  Others, as indicated based on your specific needs. Talk to your caregiver about these. Document Released: 03/26/2005 Document Revised: 06/18/2013 Document Reviewed: 11/20/2007 Exit[employer] Patient Information 2015 [employer]. This information is not intended to replace advice given to you by your health care provider. Make sure you discuss any questions you have with your health care provider.  

## 2014-09-05 LAB — URINALYSIS W MICROSCOPIC + REFLEX CULTURE
Bacteria, UA: NONE SEEN
Bilirubin Urine: NEGATIVE
Casts: NONE SEEN
Crystals: NONE SEEN
GLUCOSE, UA: NEGATIVE mg/dL
Hgb urine dipstick: NEGATIVE
KETONES UR: NEGATIVE mg/dL
Leukocytes, UA: NEGATIVE
Nitrite: NEGATIVE
PH: 5 (ref 5.0–8.0)
PROTEIN: NEGATIVE mg/dL
Specific Gravity, Urine: 1.017 (ref 1.005–1.030)
Squamous Epithelial / LPF: NONE SEEN
Urobilinogen, UA: 0.2 mg/dL (ref 0.0–1.0)

## 2014-09-12 ENCOUNTER — Other Ambulatory Visit: Payer: Self-pay

## 2014-09-12 DIAGNOSIS — Z1231 Encounter for screening mammogram for malignant neoplasm of breast: Secondary | ICD-10-CM

## 2015-02-13 ENCOUNTER — Other Ambulatory Visit: Payer: Self-pay | Admitting: Family Medicine

## 2015-02-13 ENCOUNTER — Ambulatory Visit
Admission: RE | Admit: 2015-02-13 | Discharge: 2015-02-13 | Disposition: A | Discharge status: Home / Self Care | Payer: Commercial Managed Care - HMO | Source: Ambulatory Visit | Attending: Family Medicine | Admitting: Family Medicine

## 2015-02-13 DIAGNOSIS — R0600 Dyspnea, unspecified: Secondary | ICD-10-CM

## 2015-05-28 ENCOUNTER — Encounter: Payer: Self-pay | Admitting: Women's Health

## 2015-05-28 ENCOUNTER — Ambulatory Visit (INDEPENDENT_AMBULATORY_CARE_PROVIDER_SITE_OTHER): Payer: Commercial Managed Care - HMO | Admitting: Women's Health

## 2015-05-28 VITALS — BP 124/80 | Ht 68.0 in | Wt 213.0 lb

## 2015-05-28 DIAGNOSIS — B009 Herpesviral infection, unspecified: Secondary | ICD-10-CM

## 2015-05-28 DIAGNOSIS — R232 Flushing: Secondary | ICD-10-CM

## 2015-05-28 DIAGNOSIS — N951 Menopausal and female climacteric states: Secondary | ICD-10-CM | POA: Diagnosis not present

## 2015-05-28 MED ORDER — ESTRADIOL 0.05 MG/24HR TD PTTW: 1.0000 | MEDICATED_PATCH | TRANSDERMAL | Status: DC

## 2015-05-28 NOTE — Progress Notes (Signed)
Patient ID: Tamara Brennan, female   DOB: 1960/09/18, 55 y.o.   MRN: [id number] Presents with complaint poor sleep due to numerous hot flushes. 1996 TVH for fibroids on no HRT. States has had occasional hot flushes in the past couple of years but they have recently increased especially at nighttime waking every 1-2 hours.  Exam: Appears well.  Menopausal symptoms  Plan: Options reviewed of vitamin E, black cohosh, HRT. Would like to try estradiol patch, Vivelle patch 0.05 twice weekly proper administration, risk of blood clots, strokes and breast cancer reviewed. Instructed to call if no relief will evaluate at annual exam in July. Reviewed best to stay on shortest amount of time possible.

## 2015-05-28 NOTE — Patient Instructions (Signed)
Hormone Therapy At menopause, your body begins making less estrogen and progesterone hormones. This causes the body to stop having menstrual periods. This is because estrogen and progesterone hormones control your periods and menstrual cycle. A lack of estrogen may cause symptoms such as:  Hot flushes (or hot flashes).  Vaginal dryness.  Dry skin.  Loss of sex drive.  Risk of bone loss (osteoporosis). When this happens, you may choose to take hormone therapy to get back the estrogen lost during menopause. When the hormone estrogen is given alone, it is usually referred to as ET (Estrogen Therapy). When the hormone progestin is combined with estrogen, it is generally called HT (Hormone Therapy). This was formerly known as hormone replacement therapy (HRT). Your caregiver can help you make a decision on what will be best for you. The decision to use HT seems to change often as new studies are done. Many studies do not agree on the benefits of hormone replacement therapy. LIKELY BENEFITS OF HT INCLUDE PROTECTION FROM:  Hot Flushes (also called hot flashes) - A hot flush is a sudden feeling of heat that spreads over the face and body. The skin may redden like a blush. It is connected with sweats and sleep disturbance. Women going through menopause may have hot flushes a few times a month or several times per day depending on the woman.  Osteoporosis (bone loss) - Estrogen helps guard against bone loss. After menopause, a woman's bones slowly lose calcium and become weak and brittle. As a result, bones are more likely to break. The hip, wrist, and spine are affected most often. Hormone therapy can help slow bone loss after menopause. Weight bearing exercise and taking calcium with vitamin D also can help prevent bone loss. There are also medications that your caregiver can prescribe that can help prevent osteoporosis.  Vaginal dryness - Loss of estrogen causes changes in the vagina. Its lining may  become thin and dry. These changes can cause pain and bleeding during sexual intercourse. Dryness can also lead to infections. This can cause burning and itching. (Vaginal estrogen treatment can help relieve pain, itching, and dryness.)  Urinary tract infections are more common after menopause because of lack of estrogen. Some women also develop urinary incontinence because of low estrogen levels in the vagina and bladder.  Possible other benefits of estrogen include a positive effect on mood and short-term memory in women. RISKS AND COMPLICATIONS  Using estrogen alone without progesterone causes the lining of the uterus to grow. This increases the risk of lining of the uterus (endometrial) cancer. Your caregiver should give another hormone called progestin if you have a uterus.  Women who take combined (estrogen and progestin) HT appear to have an increased risk of breast cancer. The risk appears to be small, but increases throughout the time that HT is taken.  Combined therapy also makes the breast tissue slightly denser which makes it harder to read mammograms (breast X-rays).  Combined, estrogen and progesterone therapy can be taken together every day, in which case there may be spotting of blood. HT therapy can be taken cyclically in which case you will have menstrual periods. Cyclically means HT is taken for a set amount of days, then not taken, then this process is repeated.  HT may increase the risk of stroke, heart attack, breast cancer and forming blood clots in your leg.  Transdermal estrogen (estrogen that is absorbed through the skin with a patch or a cream) may have better results with:  Cholesterol.  Blood pressure.  Blood clots. Having the following conditions may indicate you should not have HT:  Endometrial cancer.  Liver disease.  Breast cancer.  Heart disease.  History of blood clots.  Stroke. TREATMENT   If you choose to take HT and have a uterus, usually  estrogen and progestin are prescribed.  Your caregiver will help you decide the best way to take the medications.  Possible ways to take estrogen include:  Pills.  Patches.  Gels.  Sprays.  Vaginal estrogen cream, rings and tablets.  It is best to take the lowest dose possible that will help your symptoms and take them for the shortest period of time that you can.  Hormone therapy can help relieve some of the problems (symptoms) that affect women at menopause. Before making a decision about HT, talk to your caregiver about what is best for you. Be well informed and comfortable with your decisions. HOME CARE INSTRUCTIONS   Follow your caregivers advice when taking the medications.  A Pap test is done to screen for cervical cancer.  The first Pap test should be done at age 34.  Between ages 80 and 52, Pap tests are repeated every 2 years.  Beginning at age 13, you are advised to have a Pap test every 3 years as long as the past 3 Pap tests have been normal.  Some women have medical problems that increase the chance of getting cervical cancer. Talk to your caregiver about these problems. It is especially important to talk to your caregiver if a new problem develops soon after your last Pap test. In these cases, your caregiver may recommend more frequent screening and Pap tests.  The above recommendations are the same for women who have or have not gotten the vaccine for HPV (human papillomavirus).  If you had a hysterectomy for a problem that was not a cancer or a condition that could lead to cancer, then you no longer need Pap tests. However, even if you no longer need a Pap test, a regular exam is a good idea to make sure no other problems are starting.  If you are between ages 20 and 60, and you have had normal Pap tests going back 10 years, you no longer need Pap tests. However, even if you no longer need a Pap test, a regular exam is a good idea to make sure no other problems  are starting.  If you have had past treatment for cervical cancer or a condition that could lead to cancer, you need Pap tests and screening for cancer for at least 20 years after your treatment.  If Pap tests have been discontinued, risk factors (such as a new sexual partner)need to be re-assessed to determine if screening should be resumed.  Some women may need screenings more often if they are at high risk for cervical cancer.  Get mammograms done as per the advice of your caregiver. SEEK IMMEDIATE MEDICAL CARE IF:  You develop abnormal vaginal bleeding.  You have pain or swelling in your legs, shortness of breath, or chest pain.  You develop dizziness or headaches.  You have lumps or changes in your breasts or armpits.  You have slurred speech.  You develop weakness or numbness of your arms or legs.  You have pain, burning, or bleeding when urinating.  You develop abdominal pain.   This information is not intended to replace advice given to you by your health care provider. Make sure you discuss any questions  you have with your health care provider.   Document Released: 10/31/2002 Document Revised: 06/18/2014 Document Reviewed: 08/05/2014 Elsevier Interactive Patient Education 2016 [employer]  

## 2015-06-17 ENCOUNTER — Other Ambulatory Visit: Payer: Self-pay | Admitting: *Deleted

## 2015-06-17 DIAGNOSIS — B009 Herpesviral infection, unspecified: Secondary | ICD-10-CM

## 2015-06-17 MED ORDER — VALACYCLOVIR HCL 500 MG PO TABS: 500.0000 mg | ORAL_TABLET | Freq: Every day | ORAL | Status: DC

## 2015-06-17 NOTE — Telephone Encounter (Signed)
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

## 2015-08-26 ENCOUNTER — Other Ambulatory Visit: Payer: Self-pay | Admitting: *Deleted

## 2015-08-26 DIAGNOSIS — I83893 Varicose veins of bilateral lower extremities with other complications: Secondary | ICD-10-CM

## 2015-08-27 ENCOUNTER — Ambulatory Visit (HOSPITAL_COMMUNITY)
Admission: RE | Admit: 2015-08-27 | Discharge: 2015-08-27 | Disposition: A | Discharge status: Home / Self Care | Payer: Commercial Managed Care - HMO | Source: Ambulatory Visit | Attending: Vascular Surgery | Admitting: Vascular Surgery

## 2015-08-27 DIAGNOSIS — I83893 Varicose veins of bilateral lower extremities with other complications: Secondary | ICD-10-CM | POA: Diagnosis present

## 2015-08-28 ENCOUNTER — Encounter: Payer: Self-pay | Admitting: Vascular Surgery

## 2015-09-02 ENCOUNTER — Encounter: Payer: Self-pay | Admitting: Vascular Surgery

## 2015-09-02 ENCOUNTER — Ambulatory Visit (INDEPENDENT_AMBULATORY_CARE_PROVIDER_SITE_OTHER): Payer: Commercial Managed Care - HMO | Admitting: Vascular Surgery

## 2015-09-02 VITALS — BP 123/81 | HR 83 | Temp 97.5°F | Resp 16 | Ht 67.0 in | Wt 218.0 lb

## 2015-09-02 DIAGNOSIS — I8393 Asymptomatic varicose veins of bilateral lower extremities: Secondary | ICD-10-CM | POA: Diagnosis not present

## 2015-09-02 NOTE — Progress Notes (Signed)
Subjective:     Patient ID: Tamara Brennan, female   DOB: 10/21/1960, 55 y.o.   MRN: [id number]  HPI this 55 year old female is evaluated for painful veins in both lower extremities. I previously evaluated this patient in 2013 and she was noted to have bilateral spider veins. She had no significant reflux in the bilateral great saphenous veins on ultrasound exam. She has no history of DVT thrombophlebitis stasis ulcers or bleeding. She states that the veins are becoming more prominent and extending into different areas in the thighs she does have aching discomfort in these areas as the day progresses. 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 She does wear elastic compression stockings during the school year.  Past Medical History  Diagnosis Date  . STD (sexually transmitted disease)     HSV ll  . Varicose veins     Social History  Substance Use Topics  . Smoking status: Never Smoker   . Smokeless tobacco: Never Used  . Alcohol Use: No    Family History  Problem Relation Age of Onset  . Heart disease Mother   . Hypertension Sister   . Diabetes Maternal Aunt     Allergies  Allergen Reactions  . Latex Other (See Comments)    other     Current outpatient prescriptions:  .  betamethasone valerate ointment (VALISONE) 0.1 %, Apply 1 application topically 2 (two) times daily., Disp: 30 g, Rfl: 0 .  estradiol (VIVELLE-DOT) 0.05 MG/24HR patch, Place 1 patch (0.05 mg total) onto the skin 2 (two) times a week., Disp: 24 patch, Rfl: 1 .  Multiple Vitamin (MULTIVITAMIN) tablet, Take 1 tablet by mouth daily., Disp: , Rfl:  .  omeprazole (PRILOSEC) 40 MG capsule, Take 1 capsule by mouth daily., Disp: , Rfl:  .  valACYclovir (VALTREX) 500 MG tablet, Take 1 tablet (500 mg total) by mouth daily., Disp: 90 tablet, Rfl: 3  Filed Vitals:   09/02/15 1006  BP: 123/81  Pulse: 83  Temp: 97.5 F (36.4 C)  Resp: 16  Height: 5\' 7"  (1.702 m)  Weight: 218 lb (98.884 kg)   SpO2: 100%    Body mass index is 34.14 kg/(m^2).           Review of Systems denies chest pain, dyspnea on exertion, PND, orthopnea, hemoptysis, claudication    Objective:   Physical Exam BP 123/81 mmHg  Pulse 83  Temp(Src) 97.5 F (36.4 C)  Resp 16  Ht 5\' 7"  (1.702 m)  Wt 218 lb (98.884 kg)  BMI 34.14 kg/m2  SpO2 100%    Gen.-alert and oriented x3 in no apparent distress HEENT normal for age Lungs no rhonchi or wheezing Cardiovascular regular rhythm no murmurs carotid pulses 3+ palpable no bruits audible Abdomen soft nontender no palpable masses Musculoskeletal free of  major deformities Skin clear -no rashes Neurologic normal Lower extremities 3+ femoral and dorsalis pedis pulses palpable bilaterally with no edema Bilateral spider veins in the medial and lateral thigh area and lateral buttock area with no hyperpigmentation, ulceration, bulging varicosities, or other venous abnormalities noted.  Today I ordered bilateral venous duplex exam which I reviewed and interpreted. There is no DVT. There is some mild reflux at the right saphenofemoral junction but the great saphenous veins bilaterally are of normal size and without reflux      Assessment:     Bilateral spider veins with no evidence of significant gross reflux in great saphenous system  Plan:     Discussed with patient the fact that treatment would consist of foam sclerotherapy if she elected to do so Thea Silversmith discussed the specifics with the patient She will consider this and if she has further interested will be in touch with [place] otherwise return to see [place] on when necessary basis

## 2015-09-04 ENCOUNTER — Ambulatory Visit
Admission: RE | Admit: 2015-09-04 | Discharge: 2015-09-04 | Disposition: A | Discharge status: Home / Self Care | Payer: Commercial Managed Care - HMO | Source: Ambulatory Visit

## 2015-09-04 DIAGNOSIS — Z1231 Encounter for screening mammogram for malignant neoplasm of breast: Secondary | ICD-10-CM

## 2015-09-05 ENCOUNTER — Ambulatory Visit (INDEPENDENT_AMBULATORY_CARE_PROVIDER_SITE_OTHER): Payer: Commercial Managed Care - HMO | Admitting: Women's Health

## 2015-09-05 ENCOUNTER — Encounter: Payer: Self-pay | Admitting: Women's Health

## 2015-09-05 VITALS — BP 122/76 | Ht 69.0 in | Wt 218.0 lb

## 2015-09-05 DIAGNOSIS — Z01419 Encounter for gynecological examination (general) (routine) without abnormal findings: Secondary | ICD-10-CM | POA: Diagnosis not present

## 2015-09-05 DIAGNOSIS — R232 Flushing: Secondary | ICD-10-CM

## 2015-09-05 DIAGNOSIS — N951 Menopausal and female climacteric states: Secondary | ICD-10-CM | POA: Diagnosis not present

## 2015-09-05 DIAGNOSIS — B009 Herpesviral infection, unspecified: Secondary | ICD-10-CM

## 2015-09-05 DIAGNOSIS — Z13220 Encounter for screening for lipoid disorders: Secondary | ICD-10-CM

## 2015-09-05 DIAGNOSIS — Z833 Family history of diabetes mellitus: Secondary | ICD-10-CM

## 2015-09-05 LAB — COMPREHENSIVE METABOLIC PANEL
ALK PHOS: 68 U/L (ref 33–130)
ALT: 21 U/L (ref 6–29)
AST: 16 U/L (ref 10–35)
Albumin: 4.6 g/dL (ref 3.6–5.1)
BILIRUBIN TOTAL: 0.5 mg/dL (ref 0.2–1.2)
BUN: 10 mg/dL (ref 7–25)
CO2: 24 mmol/L (ref 20–31)
Calcium: 9.2 mg/dL (ref 8.6–10.4)
Chloride: 102 mmol/L (ref 98–110)
Creat: 1.06 mg/dL — ABNORMAL HIGH (ref 0.50–1.05)
GLUCOSE: 91 mg/dL (ref 65–99)
Potassium: 4.3 mmol/L (ref 3.5–5.3)
SODIUM: 139 mmol/L (ref 135–146)
Total Protein: 7.5 g/dL (ref 6.1–8.1)

## 2015-09-05 LAB — LIPID PANEL
CHOL/HDL RATIO: 4.2 ratio (ref <=5.0)
CHOLESTEROL: 188 mg/dL (ref 125–200)
HDL: 45 mg/dL — ABNORMAL LOW (ref >=46)
LDL Cholesterol: 119 mg/dL (ref <130)
Triglycerides: 119 mg/dL (ref <150)
VLDL: 24 mg/dL (ref <30)

## 2015-09-05 LAB — CBC WITH DIFFERENTIAL/PLATELET
BASOS ABS: 0 {cells}/uL (ref 0–200)
Basophils Relative: 0 %
EOS ABS: 370 {cells}/uL (ref 15–500)
EOS PCT: 5 %
HCT: 41.7 % (ref 35.0–45.0)
HEMOGLOBIN: 13.9 g/dL (ref 11.7–15.5)
Lymphocytes Relative: 36 %
Lymphs Abs: 2664 cells/uL (ref 850–3900)
MCH: 26.2 pg — AB (ref 27.0–33.0)
MCHC: 33.3 g/dL (ref 32.0–36.0)
MCV: 78.5 fL — AB (ref 80.0–100.0)
MONOS PCT: 7 %
MPV: 10.6 fL (ref 7.5–12.5)
Monocytes Absolute: 518 cells/uL (ref 200–950)
NEUTROS PCT: 52 %
Neutro Abs: 3848 cells/uL (ref 1500–7800)
PLATELETS: 356 10*3/uL (ref 140–400)
RBC: 5.31 MIL/uL — ABNORMAL HIGH (ref 3.80–5.10)
RDW: 15.4 % — ABNORMAL HIGH (ref 11.0–15.0)
WBC: 7.4 10*3/uL (ref 3.8–10.8)

## 2015-09-05 LAB — HEMOGLOBIN A1C
HEMOGLOBIN A1C: 6.1 % — AB (ref <5.7)
MEAN PLASMA GLUCOSE: 128 mg/dL

## 2015-09-05 MED ORDER — VALACYCLOVIR HCL 500 MG PO TABS: 500.0000 mg | ORAL_TABLET | Freq: Every day | ORAL | Status: DC

## 2015-09-05 MED ORDER — ESTRADIOL 0.05 MG/24HR TD PTTW: 1.0000 | MEDICATED_PATCH | TRANSDERMAL | Status: DC

## 2015-09-05 NOTE — Patient Instructions (Signed)

## 2015-09-05 NOTE — Progress Notes (Signed)
Tamara Brennan 17-Oct-1960 [id number]    History:    Presents for annual exam.  1996 TVH for fibroids on Vivelle patch with good relief of menopausal symptoms. HSV rare outbreaks. Normal Pap and mammogram history. 2013 negative colonoscopy. 2007 parathyroidectomy.  Past medical history, past surgical history, family history and social history were all reviewed and documented in the EPIC chart. Pre-K teacher. 2 daughters both doing well  ROS:  A ROS was performed and pertinent positives and negatives are included.  Exam:  Filed Vitals:   09/05/15 1006  BP: 122/76    General appearance:  Normal Thyroid:  Symmetrical, normal in size, without palpable masses or nodularity. Respiratory  Auscultation:  Clear without wheezing or rhonchi Cardiovascular  Auscultation:  Regular rate, without rubs, murmurs or gallops  Edema/varicosities:  Not grossly evident Abdominal  Soft,nontender, without masses, guarding or rebound.  Liver/spleen:  No organomegaly noted  Hernia:  None appreciated  Skin  Inspection:  Grossly normal   Breasts: Examined lying and sitting.     Right: Without masses, retractions, discharge or axillary adenopathy.     Left: Without masses, retractions, discharge or axillary adenopathy. Gentitourinary   Inguinal/mons:  Normal without inguinal adenopathy  External genitalia:  Normal  BUS/Urethra/Skene's glands:  Normal  Vagina:  Normal  Cervix: And uterus absent  Adnexa/parametria:     Rt: Without masses or tenderness.   Lt: Without masses or tenderness.  Anus and perineum: Normal  Digital rectal exam: Normal sphincter tone without palpated masses or tenderness  Assessment/Plan:  55 y.o. MBF G4 P2  for annual exam with no complaints.  37 TVH for fibroids on Vivelle patch with good relief of symptoms HSV rare outbreaks Valtrex Obesity  Plan: SBE's, continue annual screening mammogram, calcium rich diet, vitamin D 2000 daily encouraged. Encouraged to increase  regular exercise and decrease calories for weight loss. Valtrex 500 twice daily 3-5 days when necessary prescription, proper use given and reviewed. Vivelle 0.05 patch twice weekly prescription, proper use, risk for blood clots, strokes and breast cancer reviewed. Reviewed best to use shortest amount of time. CBC, CMP, lipid panel, vitamin D, UA, Pap screening guidelines reviewed.  [facility] C[facility], 12:28 PM 09/05/2015

## 2015-09-06 LAB — URINALYSIS W MICROSCOPIC + REFLEX CULTURE
BACTERIA UA: NONE SEEN [HPF]
Bilirubin Urine: NEGATIVE
CASTS: NONE SEEN [LPF]
Crystals: NONE SEEN [HPF]
Glucose, UA: NEGATIVE
Hgb urine dipstick: NEGATIVE
Ketones, ur: NEGATIVE
Leukocytes, UA: NEGATIVE
Nitrite: NEGATIVE
PROTEIN: NEGATIVE
RBC / HPF: NONE SEEN RBC/HPF (ref <=2)
Specific Gravity, Urine: 1.017 (ref 1.001–1.035)
Squamous Epithelial / LPF: NONE SEEN [HPF] (ref <=5)
WBC, UA: NONE SEEN WBC/HPF (ref <=5)
YEAST: NONE SEEN [HPF]
pH: 5.5 (ref 5.0–8.0)

## 2015-09-06 LAB — VITAMIN D 25 HYDROXY (VIT D DEFICIENCY, FRACTURES): VIT D 25 HYDROXY: 24 ng/mL — AB (ref 30–100)

## 2015-09-09 ENCOUNTER — Other Ambulatory Visit: Payer: Self-pay | Admitting: *Deleted

## 2015-09-09 MED ORDER — VITAMIN D (ERGOCALCIFEROL) 1.25 MG (50000 UNIT) PO CAPS: 50000.0000 [IU] | ORAL_CAPSULE | ORAL | 0 refills | Status: DC

## 2015-09-22 ENCOUNTER — Ambulatory Visit: Payer: Commercial Managed Care - HMO | Admitting: Allergy and Immunology

## 2016-05-17 DIAGNOSIS — M25562 Pain in left knee: Secondary | ICD-10-CM | POA: Diagnosis not present

## 2016-05-17 DIAGNOSIS — M25561 Pain in right knee: Secondary | ICD-10-CM | POA: Diagnosis not present

## 2016-05-17 DIAGNOSIS — M170 Bilateral primary osteoarthritis of knee: Secondary | ICD-10-CM | POA: Diagnosis not present

## 2016-05-19 DIAGNOSIS — E7800 Pure hypercholesterolemia, unspecified: Secondary | ICD-10-CM | POA: Diagnosis not present

## 2016-05-19 DIAGNOSIS — R7303 Prediabetes: Secondary | ICD-10-CM | POA: Diagnosis not present

## 2016-05-24 DIAGNOSIS — M25562 Pain in left knee: Secondary | ICD-10-CM | POA: Diagnosis not present

## 2016-05-24 DIAGNOSIS — M170 Bilateral primary osteoarthritis of knee: Secondary | ICD-10-CM | POA: Diagnosis not present

## 2016-05-24 DIAGNOSIS — M25561 Pain in right knee: Secondary | ICD-10-CM | POA: Diagnosis not present

## 2016-05-31 DIAGNOSIS — M25562 Pain in left knee: Secondary | ICD-10-CM | POA: Diagnosis not present

## 2016-05-31 DIAGNOSIS — M25561 Pain in right knee: Secondary | ICD-10-CM | POA: Diagnosis not present

## 2016-05-31 DIAGNOSIS — M170 Bilateral primary osteoarthritis of knee: Secondary | ICD-10-CM | POA: Diagnosis not present

## 2016-06-29 DIAGNOSIS — R2689 Other abnormalities of gait and mobility: Secondary | ICD-10-CM | POA: Diagnosis not present

## 2016-06-29 DIAGNOSIS — R262 Difficulty in walking, not elsewhere classified: Secondary | ICD-10-CM | POA: Diagnosis not present

## 2016-06-30 ENCOUNTER — Encounter: Payer: Self-pay | Admitting: Gynecology

## 2016-07-28 ENCOUNTER — Other Ambulatory Visit: Payer: Self-pay | Admitting: Women's Health

## 2016-07-28 DIAGNOSIS — Z1231 Encounter for screening mammogram for malignant neoplasm of breast: Secondary | ICD-10-CM

## 2016-09-06 ENCOUNTER — Ambulatory Visit
Admission: RE | Admit: 2016-09-06 | Discharge: 2016-09-06 | Disposition: A | Discharge status: Home / Self Care | Payer: Commercial Managed Care - HMO | Source: Ambulatory Visit | Attending: Women's Health | Admitting: Women's Health

## 2016-09-06 DIAGNOSIS — Z1231 Encounter for screening mammogram for malignant neoplasm of breast: Secondary | ICD-10-CM

## 2016-09-07 ENCOUNTER — Encounter: Payer: Self-pay | Admitting: Women's Health

## 2016-09-07 ENCOUNTER — Ambulatory Visit (INDEPENDENT_AMBULATORY_CARE_PROVIDER_SITE_OTHER): Payer: Commercial Managed Care - HMO | Admitting: Women's Health

## 2016-09-07 ENCOUNTER — Other Ambulatory Visit: Payer: Self-pay | Admitting: Women's Health

## 2016-09-07 VITALS — BP 122/78 | Ht 68.0 in | Wt 223.0 lb

## 2016-09-07 DIAGNOSIS — E890 Postprocedural hypothyroidism: Secondary | ICD-10-CM

## 2016-09-07 DIAGNOSIS — E559 Vitamin D deficiency, unspecified: Secondary | ICD-10-CM | POA: Diagnosis not present

## 2016-09-07 DIAGNOSIS — B009 Herpesviral infection, unspecified: Secondary | ICD-10-CM | POA: Diagnosis not present

## 2016-09-07 DIAGNOSIS — Z01419 Encounter for gynecological examination (general) (routine) without abnormal findings: Secondary | ICD-10-CM

## 2016-09-07 DIAGNOSIS — Z13220 Encounter for screening for lipoid disorders: Secondary | ICD-10-CM | POA: Diagnosis not present

## 2016-09-07 LAB — COMPREHENSIVE METABOLIC PANEL
ALK PHOS: 64 U/L (ref 33–130)
ALT: 24 U/L (ref 6–29)
AST: 20 U/L (ref 10–35)
Albumin: 4.6 g/dL (ref 3.6–5.1)
BUN: 10 mg/dL (ref 7–25)
CO2: 22 mmol/L (ref 20–31)
CREATININE: 1 mg/dL (ref 0.50–1.05)
Calcium: 9.3 mg/dL (ref 8.6–10.4)
Chloride: 102 mmol/L (ref 98–110)
Glucose, Bld: 85 mg/dL (ref 65–99)
POTASSIUM: 4.3 mmol/L (ref 3.5–5.3)
Sodium: 137 mmol/L (ref 135–146)
TOTAL PROTEIN: 7.5 g/dL (ref 6.1–8.1)
Total Bilirubin: 0.5 mg/dL (ref 0.2–1.2)

## 2016-09-07 LAB — CBC WITH DIFFERENTIAL/PLATELET
BASOS PCT: 0 %
Basophils Absolute: 0 cells/uL (ref 0–200)
EOS ABS: 192 {cells}/uL (ref 15–500)
Eosinophils Relative: 3 %
HCT: 40.1 % (ref 35.0–45.0)
HEMOGLOBIN: 13.4 g/dL (ref 11.7–15.5)
LYMPHS ABS: 2240 {cells}/uL (ref 850–3900)
Lymphocytes Relative: 35 %
MCH: 26.9 pg — ABNORMAL LOW (ref 27.0–33.0)
MCHC: 33.4 g/dL (ref 32.0–36.0)
MCV: 80.5 fL (ref 80.0–100.0)
MONO ABS: 448 {cells}/uL (ref 200–950)
MONOS PCT: 7 %
MPV: 10.2 fL (ref 7.5–12.5)
NEUTROS ABS: 3520 {cells}/uL (ref 1500–7800)
Neutrophils Relative %: 55 %
PLATELETS: 287 10*3/uL (ref 140–400)
RBC: 4.98 MIL/uL (ref 3.80–5.10)
RDW: 14.4 % (ref 11.0–15.0)
WBC: 6.4 10*3/uL (ref 3.8–10.8)

## 2016-09-07 LAB — LIPID PANEL
CHOL/HDL RATIO: 4.5 ratio (ref <5.0)
CHOLESTEROL: 185 mg/dL (ref <200)
HDL: 41 mg/dL — ABNORMAL LOW (ref >50)
LDL Cholesterol: 121 mg/dL — ABNORMAL HIGH (ref <100)
Triglycerides: 116 mg/dL (ref <150)
VLDL: 23 mg/dL (ref <30)

## 2016-09-07 MED ORDER — VALACYCLOVIR HCL 500 MG PO TABS: ORAL_TABLET | ORAL | 6 refills | Status: DC

## 2016-09-07 NOTE — Progress Notes (Signed)
Tamara Brennan 09/30/1960 [id number]    History:    Presents for annual exam.  TVH for fibroids had been on Vivelle patch. HSV rare outbreaks. Normal Pap and mammogram history. 2013 negative colonoscopy. 2007 parathyroidectomy benign. History of vitamin D deficiency.  Past medical history, past surgical history, family history and social history were all reviewed and documented in the EPIC chart. Pre-K teacher. 2 daughters both doing well.  ROS:  A ROS was performed and pertinent positives and negatives are included.  Exam:  Vitals:   09/07/16 1015  BP: 122/78  Weight: 223 lb (101.2 kg)  Height: 5\' 8"  (1.727 m)   Body mass index is 33.91 kg/m.   General appearance:  Normal Thyroid:  Symmetrical, normal in size, without palpable masses or nodularity. Respiratory  Auscultation:  Clear without wheezing or rhonchi Cardiovascular  Auscultation:  Regular rate, without rubs, murmurs or gallops  Edema/varicosities:  Not grossly evident Abdominal  Soft,nontender, without masses, guarding or rebound.  Liver/spleen:  No organomegaly noted  Hernia:  None appreciated  Skin  Inspection:  Grossly normal   Breasts: Examined lying and sitting.     Right: Without masses, retractions, discharge or axillary adenopathy.     Left: Without masses, retractions, discharge or axillary adenopathy. Gentitourinary   Inguinal/mons:  Normal without inguinal adenopathy  External genitalia:  Normal  BUS/Urethra/Skene's glands:  Normal  Vagina:  Normal  Cervix: and uterus absent  Adnexa/parametria:     Rt: Without masses or tenderness.   Lt: Without masses or tenderness.  Anus and perineum: Normal  Digital rectal exam: Normal sphincter tone without palpated masses or tenderness  Assessment/Plan:  56 y.o. MBF G4 P2 for annual exam with complaint of difficulty losing weight.  TVH for fibroids on no HRT HSV rare outbreaks 2007 parathyroidectomy Obesity  Plan: CBC, TSH, T3, T4, antibodies, CMP,  lipid panel, vitamin D, UA. SBE's, continue annual screening mammogram had today. Reviewed importance of increasing exercise and decreasing carbohydrates and calories in diet. Valtrex 500 twice daily for 3-5 days as needed. DEXA.     Huel Cote [facility], 1:15 PM 09/07/2016

## 2016-09-07 NOTE — Patient Instructions (Signed)
Health Maintenance for Postmenopausal Women Menopause is a normal process in which your reproductive ability comes to an end. This process happens gradually over a span of months to years, usually between the ages of 83 and 41. Menopause is complete when you have missed 12 consecutive menstrual periods. It is important to talk with your health care provider about some of the most common conditions that affect postmenopausal women, such as heart disease, cancer, and bone loss (osteoporosis). Adopting a healthy lifestyle and getting preventive care can help to promote your health and wellness. Those actions can also lower your chances of developing some of these common conditions. What should I know about menopause? During menopause, you may experience a number of symptoms, such as:  Moderate-to-severe hot flashes.  Night sweats.  Decrease in sex drive.  Mood swings.  Headaches.  Tiredness.  Irritability.  Memory problems.  Insomnia.  Choosing to treat or not to treat menopausal changes is an individual decision that you make with your health care provider. What should I know about hormone replacement therapy and supplements? Hormone therapy products are effective for treating symptoms that are associated with menopause, such as hot flashes and night sweats. Hormone replacement carries certain risks, especially as you become older. If you are thinking about using estrogen or estrogen with progestin treatments, discuss the benefits and risks with your health care provider. What should I know about heart disease and stroke? Heart disease, heart attack, and stroke become more likely as you age. This may be due, in part, to the hormonal changes that your body experiences during menopause. These can affect how your body processes dietary fats, triglycerides, and cholesterol. Heart attack and stroke are both medical emergencies. There are many things that you can do to help prevent heart  disease and stroke:  Have your blood pressure checked at least every 1-2 years. High blood pressure causes heart disease and increases the risk of stroke.  If you are 12-82 years old, ask your health care provider if you should take aspirin to prevent a heart attack or a stroke.  Do not use any tobacco products, including cigarettes, chewing tobacco, or electronic cigarettes. If you need help quitting, ask your health care provider.  It is important to eat a healthy diet and maintain a healthy weight. ? Be sure to include plenty of vegetables, fruits, low-fat dairy products, and lean protein. ? Avoid eating foods that are high in solid fats, added sugars, or salt (sodium).  Get regular exercise. This is one of the most important things that you can do for your health. ? Try to exercise for at least 150 minutes each week. The type of exercise that you do should increase your heart rate and make you sweat. This is known as moderate-intensity exercise. ? Try to do strengthening exercises at least twice each week. Do these in addition to the moderate-intensity exercise.  Know your numbers.Ask your health care provider to check your cholesterol and your blood glucose. Continue to have your blood tested as directed by your health care provider.  What should I know about cancer screening? There are several types of cancer. Take the following steps to reduce your risk and to catch any cancer development as early as possible. Breast Cancer  Practice breast self-awareness. ? This means understanding how your breasts normally appear and feel. ? It also means doing regular breast self-exams. Let your health care provider know about any changes, no matter how small.  If you are 40  or older, have a clinician do a breast exam (clinical breast exam or CBE) every year. Depending on your age, family history, and medical history, it may be recommended that you also have a yearly breast X-ray  (mammogram).  If you have a family history of breast cancer, talk with your health care provider about genetic screening.  If you are at high risk for breast cancer, talk with your health care provider about having an MRI and a mammogram every year.  Breast cancer (BRCA) gene test is recommended for women who have family members with BRCA-related cancers. Results of the assessment will determine the need for genetic counseling and BRCA1 and for BRCA2 testing. BRCA-related cancers include these types: ? Breast. This occurs in males or females. ? Ovarian. ? Tubal. This may also be called fallopian tube cancer. ? Cancer of the abdominal or pelvic lining (peritoneal cancer). ? Prostate. ? Pancreatic.  Cervical, Uterine, and Ovarian Cancer Your health care provider may recommend that you be screened regularly for cancer of the pelvic organs. These include your ovaries, uterus, and vagina. This screening involves a pelvic exam, which includes checking for microscopic changes to the surface of your cervix (Pap test).  For women ages 21-65, health care providers may recommend a pelvic exam and a Pap test every three years. For women ages 72-65, they may recommend the Pap test and pelvic exam, combined with testing for human papilloma virus (HPV), every five years. Some types of HPV increase your risk of cervical cancer. Testing for HPV may also be done on women of any age who have unclear Pap test results.  Other health care providers may not recommend any screening for nonpregnant women who are considered low risk for pelvic cancer and have no symptoms. Ask your health care provider if a screening pelvic exam is right for you.  If you have had past treatment for cervical cancer or a condition that could lead to cancer, you need Pap tests and screening for cancer for at least 20 years after your treatment. If Pap tests have been discontinued for you, your risk factors (such as having a new sexual  partner) need to be reassessed to determine if you should start having screenings again. Some women have medical problems that increase the chance of getting cervical cancer. In these cases, your health care provider may recommend that you have screening and Pap tests more often.  If you have a family history of uterine cancer or ovarian cancer, talk with your health care provider about genetic screening.  If you have vaginal bleeding after reaching menopause, tell your health care provider.  There are currently no reliable tests available to screen for ovarian cancer.  Lung Cancer Lung cancer screening is recommended for adults 65-82 years old who are at high risk for lung cancer because of a history of smoking. A yearly low-dose CT scan of the lungs is recommended if you:  Currently smoke.  Have a history of at least 30 pack-years of smoking and you currently smoke or have quit within the past 15 years. A pack-year is smoking an average of one pack of cigarettes per day for one year.  Yearly screening should:  Continue until it has been 15 years since you quit.  Stop if you develop a health problem that would prevent you from having lung cancer treatment.  Colorectal Cancer  This type of cancer can be detected and can often be prevented.  Routine colorectal cancer screening usually begins at  age 30 and continues through age 22.  If you have risk factors for colon cancer, your health care provider may recommend that you be screened at an earlier age.  If you have a family history of colorectal cancer, talk with your health care provider about genetic screening.  Your health care provider may also recommend using home test kits to check for hidden blood in your stool.  A small camera at the end of a tube can be used to examine your colon directly (sigmoidoscopy or colonoscopy). This is done to check for the earliest forms of colorectal cancer.  Direct examination of the colon  should be repeated every 5-10 years until age 28. However, if early forms of precancerous polyps or small growths are found or if you have a family history or genetic risk for colorectal cancer, you may need to be screened more often.  Skin Cancer  Check your skin from head to toe regularly.  Monitor any moles. Be sure to tell your health care provider: ? About any new moles or changes in moles, especially if there is a change in a mole's shape or color. ? If you have a mole that is larger than the size of a pencil eraser.  If any of your family members has a history of skin cancer, especially at a Rajan Burgard age, talk with your health care provider about genetic screening.  Always use sunscreen. Apply sunscreen liberally and repeatedly throughout the day.  Whenever you are outside, protect yourself by wearing long sleeves, pants, a wide-brimmed hat, and sunglasses.  What should I know about osteoporosis? Osteoporosis is a condition in which bone destruction happens more quickly than new bone creation. After menopause, you may be at an increased risk for osteoporosis. To help prevent osteoporosis or the bone fractures that can happen because of osteoporosis, the following is recommended:  If you are 62-69 years old, get at least 1,000 mg of calcium and at least 600 mg of vitamin D per day.  If you are older than age 60 but younger than age 68, get at least 1,200 mg of calcium and at least 600 mg of vitamin D per day.  If you are older than age 35, get at least 1,200 mg of calcium and at least 800 mg of vitamin D per day.  Smoking and excessive alcohol intake increase the risk of osteoporosis. Eat foods that are rich in calcium and vitamin D, and do weight-bearing exercises several times each week as directed by your health care provider. What should I know about how menopause affects my mental health? Depression may occur at any age, but it is more common as you become older. Common symptoms of  depression include:  Low or sad mood.  Changes in sleep patterns.  Changes in appetite or eating patterns.  Feeling an overall lack of motivation or enjoyment of activities that you previously enjoyed.  Frequent crying spells.  Talk with your health care provider if you think that you are experiencing depression. What should I know about immunizations? It is important that you get and maintain your immunizations. These include:  Tetanus, diphtheria, and pertussis (Tdap) booster vaccine.  Influenza every year before the flu season begins.  Pneumonia vaccine.  Shingles vaccine.  Your health care provider may also recommend other immunizations. This information is not intended to replace advice given to you by your health care provider. Make sure you discuss any questions you have with your health care provider. Document Released: 03/26/2005  Document Revised: 08/22/2015 Document Reviewed: 11/05/2014 [employer] Interactive Patient Education  2018 [employer]  

## 2016-09-08 DIAGNOSIS — H11159 Pinguecula, unspecified eye: Secondary | ICD-10-CM | POA: Diagnosis not present

## 2016-09-08 LAB — URINALYSIS W MICROSCOPIC + REFLEX CULTURE
BACTERIA UA: NONE SEEN [HPF]
BILIRUBIN URINE: NEGATIVE
Casts: NONE SEEN [LPF]
Crystals: NONE SEEN [HPF]
GLUCOSE, UA: NEGATIVE
HGB URINE DIPSTICK: NEGATIVE
KETONES UR: NEGATIVE
Leukocytes, UA: NEGATIVE
NITRITE: NEGATIVE
PH: 5.5 (ref 5.0–8.0)
Protein, ur: NEGATIVE
RBC / HPF: NONE SEEN RBC/HPF (ref <=2)
SQUAMOUS EPITHELIAL / LPF: NONE SEEN [HPF] (ref <=5)
Specific Gravity, Urine: 1.012 (ref 1.001–1.035)
WBC, UA: NONE SEEN WBC/HPF (ref <=5)
Yeast: NONE SEEN [HPF]

## 2016-09-08 LAB — THYROID ANTIBODIES
Thyroglobulin Ab: <1 IU/mL (ref <2)
Thyroperoxidase Ab SerPl-aCnc: <1 IU/mL (ref <9)

## 2016-09-08 LAB — T4: T4, Total: 7.5 ug/dL (ref 4.5–12.0)

## 2016-09-08 LAB — T3 UPTAKE: T3 Uptake: 24 % (ref 22–35)

## 2016-09-08 LAB — VITAMIN D 25 HYDROXY (VIT D DEFICIENCY, FRACTURES): Vit D, 25-Hydroxy: 29 ng/mL — ABNORMAL LOW (ref 30–100)

## 2016-09-09 LAB — TSH: TSH: 3.52 m[IU]/L

## 2016-11-29 DIAGNOSIS — R7303 Prediabetes: Secondary | ICD-10-CM | POA: Diagnosis not present

## 2016-11-29 DIAGNOSIS — Z23 Encounter for immunization: Secondary | ICD-10-CM | POA: Diagnosis not present

## 2016-11-29 DIAGNOSIS — E7800 Pure hypercholesterolemia, unspecified: Secondary | ICD-10-CM | POA: Diagnosis not present

## 2016-11-29 DIAGNOSIS — Z0000 Encounter for general adult medical examination without abnormal findings: Secondary | ICD-10-CM | POA: Diagnosis not present

## 2016-12-28 ENCOUNTER — Encounter (INDEPENDENT_AMBULATORY_CARE_PROVIDER_SITE_OTHER): Payer: 59

## 2016-12-28 DIAGNOSIS — I868 Varicose veins of other specified sites: Secondary | ICD-10-CM

## 2017-05-11 DIAGNOSIS — H109 Unspecified conjunctivitis: Secondary | ICD-10-CM | POA: Diagnosis not present

## 2017-08-10 ENCOUNTER — Other Ambulatory Visit: Payer: Self-pay | Admitting: Women's Health

## 2017-08-10 DIAGNOSIS — Z1231 Encounter for screening mammogram for malignant neoplasm of breast: Secondary | ICD-10-CM

## 2017-09-13 ENCOUNTER — Encounter: Payer: Self-pay | Admitting: Women's Health

## 2017-09-13 ENCOUNTER — Ambulatory Visit
Admission: RE | Admit: 2017-09-13 | Discharge: 2017-09-13 | Disposition: A | Discharge status: Home / Self Care | Payer: 59 | Source: Ambulatory Visit | Attending: Women's Health | Admitting: Women's Health

## 2017-09-13 ENCOUNTER — Ambulatory Visit: Payer: 59 | Admitting: Women's Health

## 2017-09-13 VITALS — BP 118/84 | Ht 67.0 in | Wt 219.2 lb

## 2017-09-13 DIAGNOSIS — Z01419 Encounter for gynecological examination (general) (routine) without abnormal findings: Secondary | ICD-10-CM | POA: Diagnosis not present

## 2017-09-13 DIAGNOSIS — Z1231 Encounter for screening mammogram for malignant neoplasm of breast: Secondary | ICD-10-CM

## 2017-09-13 DIAGNOSIS — B009 Herpesviral infection, unspecified: Secondary | ICD-10-CM

## 2017-09-13 DIAGNOSIS — R7309 Other abnormal glucose: Secondary | ICD-10-CM | POA: Diagnosis not present

## 2017-09-13 DIAGNOSIS — Z13820 Encounter for screening for osteoporosis: Secondary | ICD-10-CM

## 2017-09-13 MED ORDER — VALACYCLOVIR HCL 500 MG PO TABS: ORAL_TABLET | ORAL | 6 refills | Status: DC

## 2017-09-13 NOTE — Patient Instructions (Addendum)
Health Maintenance for Postmenopausal Women Menopause is a normal process in which your reproductive ability comes to an end. This process happens gradually over a span of months to years, usually between the ages of 22 and 9. Menopause is complete when you have missed 12 consecutive menstrual periods. It is important to talk with your health care provider about some of the most common conditions that affect postmenopausal women, such as heart disease, cancer, and bone loss (osteoporosis). Adopting a healthy lifestyle and getting preventive care can help to promote your health and wellness. Those actions can also lower your chances of developing some of these common conditions. What should I know about menopause? During menopause, you may experience a number of symptoms, such as:  Moderate-to-severe hot flashes.  Night sweats.  Decrease in sex drive.  Mood swings.  Headaches.  Tiredness.  Irritability.  Memory problems.  Insomnia.  Choosing to treat or not to treat menopausal changes is an individual decision that you make with your health care provider. What should I know about hormone replacement therapy and supplements? Hormone therapy products are effective for treating symptoms that are associated with menopause, such as hot flashes and night sweats. Hormone replacement carries certain risks, especially as you become older. If you are thinking about using estrogen or estrogen with progestin treatments, discuss the benefits and risks with your health care provider. What should I know about heart disease and stroke? Heart disease, heart attack, and stroke become more likely as you age. This may be due, in part, to the hormonal changes that your body experiences during menopause. These can affect how your body processes dietary fats, triglycerides, and cholesterol. Heart attack and stroke are both medical emergencies. There are many things that you can do to help prevent heart disease  and stroke:  Have your blood pressure checked at least every 1-2 years. High blood pressure causes heart disease and increases the risk of stroke.  If you are 53-22 years old, ask your health care provider if you should take aspirin to prevent a heart attack or a stroke.  Do not use any tobacco products, including cigarettes, chewing tobacco, or electronic cigarettes. If you need help quitting, ask your health care provider.  It is important to eat a healthy diet and maintain a healthy weight. ? Be sure to include plenty of vegetables, fruits, low-fat dairy products, and lean protein. ? Avoid eating foods that are high in solid fats, added sugars, or salt (sodium).  Get regular exercise. This is one of the most important things that you can do for your health. ? Try to exercise for at least 150 minutes each week. The type of exercise that you do should increase your heart rate and make you sweat. This is known as moderate-intensity exercise. ? Try to do strengthening exercises at least twice each week. Do these in addition to the moderate-intensity exercise.  Know your numbers.Ask your health care provider to check your cholesterol and your blood glucose. Continue to have your blood tested as directed by your health care provider.  What should I know about cancer screening? There are several types of cancer. Take the following steps to reduce your risk and to catch any cancer development as early as possible. Breast Cancer  Practice breast self-awareness. ? This means understanding how your breasts normally appear and feel. ? It also means doing regular breast self-exams. Let your health care provider know about any changes, no matter how small.  If you are 40  or older, have a clinician do a breast exam (clinical breast exam or CBE) every year. Depending on your age, family history, and medical history, it may be recommended that you also have a yearly breast X-ray (mammogram).  If you  have a family history of breast cancer, talk with your health care provider about genetic screening.  If you are at high risk for breast cancer, talk with your health care provider about having an MRI and a mammogram every year.  Breast cancer (BRCA) gene test is recommended for women who have family members with BRCA-related cancers. Results of the assessment will determine the need for genetic counseling and BRCA1 and for BRCA2 testing. BRCA-related cancers include these types: ? Breast. This occurs in males or females. ? Ovarian. ? Tubal. This may also be called fallopian tube cancer. ? Cancer of the abdominal or pelvic lining (peritoneal cancer). ? Prostate. ? Pancreatic.  Cervical, Uterine, and Ovarian Cancer Your health care provider may recommend that you be screened regularly for cancer of the pelvic organs. These include your ovaries, uterus, and vagina. This screening involves a pelvic exam, which includes checking for microscopic changes to the surface of your cervix (Pap test).  For women ages 21-65, health care providers may recommend a pelvic exam and a Pap test every three years. For women ages 79-65, they may recommend the Pap test and pelvic exam, combined with testing for human papilloma virus (HPV), every five years. Some types of HPV increase your risk of cervical cancer. Testing for HPV may also be done on women of any age who have unclear Pap test results.  Other health care providers may not recommend any screening for nonpregnant women who are considered low risk for pelvic cancer and have no symptoms. Ask your health care provider if a screening pelvic exam is right for you.  If you have had past treatment for cervical cancer or a condition that could lead to cancer, you need Pap tests and screening for cancer for at least 20 years after your treatment. If Pap tests have been discontinued for you, your risk factors (such as having a new sexual partner) need to be  reassessed to determine if you should start having screenings again. Some women have medical problems that increase the chance of getting cervical cancer. In these cases, your health care provider may recommend that you have screening and Pap tests more often.  If you have a family history of uterine cancer or ovarian cancer, talk with your health care provider about genetic screening.  If you have vaginal bleeding after reaching menopause, tell your health care provider.  There are currently no reliable tests available to screen for ovarian cancer.  Lung Cancer Lung cancer screening is recommended for adults 69-62 years old who are at high risk for lung cancer because of a history of smoking. A yearly low-dose CT scan of the lungs is recommended if you:  Currently smoke.  Have a history of at least 30 pack-years of smoking and you currently smoke or have quit within the past 15 years. A pack-year is smoking an average of one pack of cigarettes per day for one year.  Yearly screening should:  Continue until it has been 15 years since you quit.  Stop if you develop a health problem that would prevent you from having lung cancer treatment.  Colorectal Cancer  This type of cancer can be detected and can often be prevented.  Routine colorectal cancer screening usually begins at  age 42 and continues through age 45.  If you have risk factors for colon cancer, your health care provider may recommend that you be screened at an earlier age.  If you have a family history of colorectal cancer, talk with your health care provider about genetic screening.  Your health care provider may also recommend using home test kits to check for hidden blood in your stool.  A small camera at the end of a tube can be used to examine your colon directly (sigmoidoscopy or colonoscopy). This is done to check for the earliest forms of colorectal cancer.  Direct examination of the colon should be repeated every  5-10 years until age 71. However, if early forms of precancerous polyps or small growths are found or if you have a family history or genetic risk for colorectal cancer, you may need to be screened more often.  Skin Cancer  Check your skin from head to toe regularly.  Monitor any moles. Be sure to tell your health care provider: ? About any new moles or changes in moles, especially if there is a change in a mole's shape or color. ? If you have a mole that is larger than the size of a pencil eraser.  If any of your family members has a history of skin cancer, especially at a Dilia Alemany age, talk with your health care provider about genetic screening.  Always use sunscreen. Apply sunscreen liberally and repeatedly throughout the day.  Whenever you are outside, protect yourself by wearing long sleeves, pants, a wide-brimmed hat, and sunglasses.  What should I know about osteoporosis? Osteoporosis is a condition in which bone destruction happens more quickly than new bone creation. After menopause, you may be at an increased risk for osteoporosis. To help prevent osteoporosis or the bone fractures that can happen because of osteoporosis, the following is recommended:  If you are 46-71 years old, get at least 1,000 mg of calcium and at least 600 mg of vitamin D per day.  If you are older than age 55 but younger than age 65, get at least 1,200 mg of calcium and at least 600 mg of vitamin D per day.  If you are older than age 54, get at least 1,200 mg of calcium and at least 800 mg of vitamin D per day.  Smoking and excessive alcohol intake increase the risk of osteoporosis. Eat foods that are rich in calcium and vitamin D, and do weight-bearing exercises several times each week as directed by your health care provider. What should I know about how menopause affects my mental health? Depression may occur at any age, but it is more common as you become older. Common symptoms of depression  include:  Low or sad mood.  Changes in sleep patterns.  Changes in appetite or eating patterns.  Feeling an overall lack of motivation or enjoyment of activities that you previously enjoyed.  Frequent crying spells.  Talk with your health care provider if you think that you are experiencing depression. What should I know about immunizations? It is important that you get and maintain your immunizations. These include:  Tetanus, diphtheria, and pertussis (Tdap) booster vaccine.  Influenza every year before the flu season begins.  Pneumonia vaccine.  Shingles vaccine.  Your health care provider may also recommend other immunizations. This information is not intended to replace advice given to you by your health care provider. Make sure you discuss any questions you have with your health care provider. Document Released: 03/26/2005  Document Revised: 08/22/2015 Document Reviewed: 11/05/2014 Elsevier Interactive Patient Education  2018 [employer] Carbohydrate Counting for Diabetes Mellitus, Adult Carbohydrate counting is a method for keeping track of how many carbohydrates you eat. Eating carbohydrates naturally increases the amount of sugar (glucose) in the blood. Counting how many carbohydrates you eat helps keep your blood glucose within normal limits, which helps you manage your diabetes (diabetes mellitus). It is important to know how many carbohydrates you can safely have in each meal. This is different for every person. A diet and nutrition specialist (registered dietitian) can help you make a meal plan and calculate how many carbohydrates you should have at each meal and snack. Carbohydrates are found in the following foods:  Grains, such as breads and cereals.  Dried beans and soy products.  Starchy vegetables, such as potatoes, peas, and corn.  Fruit and fruit juices.  Milk and yogurt.  Sweets and snack foods, such as cake, cookies, candy, chips, and soft  drinks.  How do I count carbohydrates? There are two ways to count carbohydrates in food. You can use either of the methods or a combination of both. Reading "Nutrition Facts" on packaged food The "Nutrition Facts" list is included on the labels of almost all packaged foods and beverages in the [place] It includes:  The serving size.  Information about nutrients in each serving, including the grams (g) of carbohydrate per serving.  To use the "Nutrition Facts":  Decide how many servings you will have.  Multiply the number of servings by the number of carbohydrates per serving.  The resulting number is the total amount of carbohydrates that you will be having.  Learning standard serving sizes of other foods When you eat foods containing carbohydrates that are not packaged or do not include "Nutrition Facts" on the label, you need to measure the servings in order to count the amount of carbohydrates:  Measure the foods that you will eat with a food scale or measuring cup, if needed.  Decide how many standard-size servings you will eat.  Multiply the number of servings by 15. Most carbohydrate-rich foods have about 15 g of carbohydrates per serving. ? For example, if you eat 8 oz (170 g) of strawberries, you will have eaten 2 servings and 30 g of carbohydrates (2 servings x 15 g = 30 g).  For foods that have more than one food mixed, such as soups and casseroles, you must count the carbohydrates in each food that is included.  The following list contains standard serving sizes of common carbohydrate-rich foods. Each of these servings has about 15 g of carbohydrates:   hamburger bun or  English muffin.   oz (15 mL) syrup.   oz (14 g) jelly.  1 slice of bread.  1 six-inch tortilla.  3 oz (85 g) cooked rice or pasta.  4 oz (113 g) cooked dried beans.  4 oz (113 g) starchy vegetable, such as peas, corn, or potatoes.  4 oz (113 g) hot cereal.  4 oz (113 g) mashed potatoes  or  of a large baked potato.  4 oz (113 g) canned or frozen fruit.  4 oz (120 mL) fruit juice.  4-6 crackers.  6 chicken nuggets.  6 oz (170 g) unsweetened dry cereal.  6 oz (170 g) plain fat-free yogurt or yogurt sweetened with artificial sweeteners.  8 oz (240 mL) milk.  8 oz (170 g) fresh fruit or one small piece of fruit.  24 oz (680 g) popped   popcorn.  Example of carbohydrate counting Sample meal  3 oz (85 g) chicken breast.  6 oz (170 g) brown rice.  4 oz (113 g) corn.  8 oz (240 mL) milk.  8 oz (170 g) strawberries with sugar-free whipped topping. Carbohydrate calculation 1. Identify the foods that contain carbohydrates: ? Rice. ? Corn. ? Milk. ? Strawberries. 2. Calculate how many servings you have of each food: ? 2 servings rice. ? 1 serving corn. ? 1 serving milk. ? 1 serving strawberries. 3. Multiply each number of servings by 15 g: ? 2 servings rice x 15 g = 30 g. ? 1 serving corn x 15 g = 15 g. ? 1 serving milk x 15 g = 15 g. ? 1 serving strawberries x 15 g = 15 g. 4. Add together all of the amounts to find the total grams of carbohydrates eaten: ? 30 g + 15 g + 15 g + 15 g = 75 g of carbohydrates total. This information is not intended to replace advice given to you by your health care provider. Make sure you discuss any questions you have with your health care provider. Document Released: 02/01/2005 Document Revised: 08/22/2015 Document Reviewed: 07/16/2015 Elsevier Interactive Patient Education  2018 [employer] Inc[employer]  

## 2017-09-13 NOTE — Progress Notes (Signed)
Tamara Brennan 1960-05-12 [id number]    History:    Presents for annual exam.  TVH for fibroids on no HRT.  Normal Pap and mammogram history.  2013- colonoscopy.  Saw dermatologist this week he told her she had darkness around entire neck needed to get a blood sugar check.  Past medical history, past surgical history, family history and social history were all reviewed and documented in the EPIC chart.  Pre-k Pharmacist, hospital.  2 daughters both doing well.  ROS:  A ROS was performed and pertinent positives and negatives are included.  Exam:  Vitals:   09/13/17 1037  BP: 118/84  Weight: 219 lb 3.2 oz (99.4 kg)  Height: 5\' 7"  (1.702 m)   Body mass index is 34.33 kg/m.   General appearance:  Normal Thyroid:  Symmetrical, normal in size, without palpable masses or nodularity. Respiratory  Auscultation:  Clear without wheezing or rhonchi Cardiovascular  Auscultation:  Regular rate, without rubs, murmurs or gallops  Edema/varicosities:  Not grossly evident Abdominal  Soft,nontender, without masses, guarding or rebound.  Liver/spleen:  No organomegaly noted  Hernia:  None appreciated  Skin  Inspection:  Grossly normal acanthosis nigricans   Breasts: Examined lying and sitting.     Right: Without masses, retractions, discharge or axillary adenopathy.     Left: Without masses, retractions, discharge or axillary adenopathy. Gentitourinary   Inguinal/mons:  Normal without inguinal adenopathy  External genitalia:  Normal  BUS/Urethra/Skene's glands:  Normal  Vagina:  Normal  Cervix: And uterus absent  Adnexa/parametria:     Rt: Without masses or tenderness.   Lt: Without masses or tenderness.  Anus and perineum: Normal  Digital rectal exam: Normal sphincter tone without palpated masses or tenderness  Assessment/Plan:  57 y.o. MBF G4, P2 for annual exam with no complaints.  TVH on no HRT HSV rare outbreaks Obesity/acanthosis nigricans Labs primary care  Plan: SBE's, continue  annual screening mammogram, calcium rich foods, vitamin D 2000 daily encouraged.  Continue to decrease simple carbs/calories for weight loss.  Ultrex 500 twice daily for 3 to 5 days as needed.  Hemoglobin A1c, today, will follow up with primary care in October.  DEXA will schedule.  Tamara Brennan. Tamara Brennan  WHNP, 1:24 PM 09/13/2017

## 2017-09-14 LAB — HEMOGLOBIN A1C
EAG (MMOL/L): 7.1 (calc)
Hgb A1c MFr Bld: 6.1 % of total Hgb — ABNORMAL HIGH (ref <5.7)
Mean Plasma Glucose: 128 (calc)

## 2017-09-20 DIAGNOSIS — E890 Postprocedural hypothyroidism: Secondary | ICD-10-CM | POA: Insufficient documentation

## 2017-09-20 DIAGNOSIS — H6122 Impacted cerumen, left ear: Secondary | ICD-10-CM | POA: Diagnosis not present

## 2017-09-20 DIAGNOSIS — Z8719 Personal history of other diseases of the digestive system: Secondary | ICD-10-CM | POA: Diagnosis not present

## 2017-09-20 DIAGNOSIS — Z8639 Personal history of other endocrine, nutritional and metabolic disease: Secondary | ICD-10-CM | POA: Diagnosis not present

## 2017-09-20 DIAGNOSIS — H6123 Impacted cerumen, bilateral: Secondary | ICD-10-CM | POA: Insufficient documentation

## 2017-09-20 DIAGNOSIS — Z9009 Acquired absence of other part of head and neck: Secondary | ICD-10-CM | POA: Insufficient documentation

## 2017-10-02 ENCOUNTER — Other Ambulatory Visit: Payer: Self-pay | Admitting: Women's Health

## 2017-10-02 DIAGNOSIS — B009 Herpesviral infection, unspecified: Secondary | ICD-10-CM

## 2017-11-25 DIAGNOSIS — M25562 Pain in left knee: Secondary | ICD-10-CM | POA: Diagnosis not present

## 2017-11-25 DIAGNOSIS — M25561 Pain in right knee: Secondary | ICD-10-CM | POA: Diagnosis not present

## 2018-03-15 DIAGNOSIS — M170 Bilateral primary osteoarthritis of knee: Secondary | ICD-10-CM | POA: Diagnosis not present

## 2018-03-16 ENCOUNTER — Ambulatory Visit (INDEPENDENT_AMBULATORY_CARE_PROVIDER_SITE_OTHER): Payer: 59

## 2018-03-16 ENCOUNTER — Other Ambulatory Visit: Payer: Self-pay | Admitting: Podiatry

## 2018-03-16 ENCOUNTER — Ambulatory Visit: Payer: 59 | Admitting: Podiatry

## 2018-03-16 ENCOUNTER — Encounter: Payer: Self-pay | Admitting: Podiatry

## 2018-03-16 DIAGNOSIS — M79671 Pain in right foot: Secondary | ICD-10-CM

## 2018-03-16 DIAGNOSIS — G629 Polyneuropathy, unspecified: Secondary | ICD-10-CM

## 2018-03-16 DIAGNOSIS — M79672 Pain in left foot: Secondary | ICD-10-CM

## 2018-03-16 MED ORDER — GABAPENTIN 300 MG PO CAPS: 300.0000 mg | ORAL_CAPSULE | Freq: Three times a day (TID) | ORAL | 3 refills | Status: DC

## 2018-03-17 NOTE — Progress Notes (Signed)
Subjective:   Patient ID: Tamara Brennan, female   DOB: 58 y.o.   MRN: [id number]   HPI Patient presents stating that she developed burning in the bottom of both feet over the last month and does not remember specific injury which occurred.  Patient states it gradually worsens over time but it is not causing any balance issues or loss of structure.  The feet are also moderately tender.  Patient does not smoke and likes to be active   Review of Systems  All other systems reviewed and are negative.       Objective:  Physical Exam Vitals signs and nursing note reviewed.  Constitutional:      Appearance: She is well-developed.  Pulmonary:     Effort: Pulmonary effort is normal.  Musculoskeletal: Normal range of motion.  Skin:    General: Skin is warm.  Neurological:     Mental Status: She is alert.     Neurovascular status was found to be intact muscle strength was adequate and I did check sharp dull vibratory and found to be intact.  Patient is noted to have good digital perfusion moderate depression of the arch bilateral and no changes on DTR reflexes.  Patient has a history of back problems which could be contributory    Assessment:  Possibility for an acute type neuropathic condition or possibility for condition related to back or other pathology     Plan:  H&P condition reviewed and I have recommended starting gabapentin with 1 at night and then possibly adding 1 in the morning midday and I explained the gabapentin and symptoms to watch out for.  We may be able to just keep her on for a short period of time and will make a decision I also discussed a neurological consult if symptoms persist or get worse  X-rays indicate that there is no signs of stress fracture or arthritis which may be contributory to this condition

## 2018-04-07 ENCOUNTER — Other Ambulatory Visit: Payer: Self-pay | Admitting: Podiatry

## 2018-04-13 DIAGNOSIS — M170 Bilateral primary osteoarthritis of knee: Secondary | ICD-10-CM | POA: Diagnosis not present

## 2018-04-27 ENCOUNTER — Ambulatory Visit: Payer: 59 | Admitting: Podiatry

## 2018-04-27 DIAGNOSIS — M170 Bilateral primary osteoarthritis of knee: Secondary | ICD-10-CM | POA: Diagnosis not present

## 2018-05-04 DIAGNOSIS — M170 Bilateral primary osteoarthritis of knee: Secondary | ICD-10-CM | POA: Diagnosis not present

## 2018-05-08 ENCOUNTER — Other Ambulatory Visit: Payer: Self-pay

## 2018-05-11 DIAGNOSIS — M170 Bilateral primary osteoarthritis of knee: Secondary | ICD-10-CM | POA: Diagnosis not present

## 2018-07-19 ENCOUNTER — Other Ambulatory Visit: Payer: Self-pay | Admitting: Family Medicine

## 2018-07-19 DIAGNOSIS — Z1231 Encounter for screening mammogram for malignant neoplasm of breast: Secondary | ICD-10-CM

## 2018-07-31 ENCOUNTER — Other Ambulatory Visit: Payer: Self-pay

## 2018-08-01 ENCOUNTER — Other Ambulatory Visit: Payer: Self-pay | Admitting: Women's Health

## 2018-08-01 ENCOUNTER — Ambulatory Visit (INDEPENDENT_AMBULATORY_CARE_PROVIDER_SITE_OTHER): Payer: 59

## 2018-08-01 DIAGNOSIS — Z13820 Encounter for screening for osteoporosis: Secondary | ICD-10-CM | POA: Diagnosis not present

## 2018-08-01 DIAGNOSIS — Z780 Asymptomatic menopausal state: Secondary | ICD-10-CM

## 2018-08-24 NOTE — Telephone Encounter (Signed)
Patient informed with my chart message, came back unread

## 2018-09-15 ENCOUNTER — Other Ambulatory Visit: Payer: Self-pay

## 2018-09-18 ENCOUNTER — Ambulatory Visit: Payer: 59 | Admitting: Women's Health

## 2018-09-18 ENCOUNTER — Other Ambulatory Visit: Payer: Self-pay

## 2018-09-18 ENCOUNTER — Encounter: Payer: Self-pay | Admitting: Women's Health

## 2018-09-18 VITALS — BP 126/78 | Ht 67.0 in | Wt 223.0 lb

## 2018-09-18 DIAGNOSIS — Z13220 Encounter for screening for lipoid disorders: Secondary | ICD-10-CM | POA: Diagnosis not present

## 2018-09-18 DIAGNOSIS — R7309 Other abnormal glucose: Secondary | ICD-10-CM

## 2018-09-18 DIAGNOSIS — Z01419 Encounter for gynecological examination (general) (routine) without abnormal findings: Secondary | ICD-10-CM

## 2018-09-18 MED ORDER — ESTRADIOL 0.5 MG PO TABS: 0.5000 mg | ORAL_TABLET | Freq: Every day | ORAL | 4 refills | Status: DC

## 2018-09-18 NOTE — Progress Notes (Signed)
Tamara Brennan 1960/09/27 [id number]    History:    Presents for annual exam.  1995 TVH for fibroids/menorrhagia was on estradiol patch  prefers no medication but is having poor sleep.  Normal Pap and mammogram history.  2020 normal DEXA.  2013- colonoscopy.  History of elevated hemoglobin A1c/prediabetes.  HSV no outbreaks.  Past medical history, past surgical history, family history and social history were all reviewed and documented in the EPIC chart.  Pre-k Pharmacist, hospital.  2 daughters both doing well.  ROS:  A ROS was performed and pertinent positives and negatives are included.  Exam:  Vitals:   09/18/18 1032  BP: 126/78  Weight: 223 lb (101.2 kg)  Height: 5\' 7"  (1.702 m)   Body mass index is 34.93 kg/m.   General appearance:  Normal Thyroid:  Symmetrical, normal in size, without palpable masses or nodularity. Respiratory  Auscultation:  Clear without wheezing or rhonchi Cardiovascular  Auscultation:  Regular rate, without rubs, murmurs or gallops  Edema/varicosities:  Not grossly evident Abdominal  Soft,nontender, without masses, guarding or rebound.  Liver/spleen:  No organomegaly noted  Hernia:  None appreciated  Skin  Inspection:  Grossly normal   Breasts: Examined lying and sitting.     Right: Without masses, retractions, discharge or axillary adenopathy.     Left: Without masses, retractions, discharge or axillary adenopathy. Gentitourinary   Inguinal/mons:  Normal without inguinal adenopathy  External genitalia:  Normal  BUS/Urethra/Skene's glands:  Normal  Vagina:  Normal  Cervix: And uterus absent   Adnexa/parametria:     Rt: Without masses or tenderness.   Lt: Without masses or tenderness.  Anus and perineum: Normal  Digital rectal exam: Normal sphincter tone without palpated masses or tenderness  Assessment/Plan:  58 y.o. MBF G2, P2 for annual exam with numerous hot flushes causing poor sleep.  TVH for menorrhagia/fibroids Obesity History of   hemoglobin A1c 6.1 Reflux-primary care managing HSV-no outbreaks  Plan:.  Reviewed importance of weight loss to help prevent type 2 diabetes and decrease reflux.  Weight watchers encouraged.  SBEs, continue annual screening mammogram, calcium rich foods, vitamin D 2000 daily encouraged.  Management of hot flashes reviewed will try estradiol 0.5 mg at bedtime will decrease to half tablet once hot flashes decrease, requested lowest possible amount.  Reviewed slight risk for blood clots, strokes and breast cancer.  Instructed to call if continued problems.  CBC, CMP, hemoglobin A1c, lipid panel.    [place], 11:01 AM 09/18/2018

## 2018-09-18 NOTE — Patient Instructions (Addendum)
Food Choices for Gastroesophageal Reflux Disease, Adult When you have gastroesophageal reflux disease (GERD), the foods you eat and your eating habits are very important. Choosing the right foods can help ease your discomfort. Think about working with a nutrition specialist (dietitian) to help you make good choices. What are tips for following this plan?  Meals  Choose healthy foods that are low in fat, such as fruits, vegetables, whole grains, low-fat dairy products, and lean meat, fish, and poultry.  Eat small meals often instead of 3 large meals a day. Eat your meals slowly, and in a place where you are relaxed. Avoid bending over or lying down until 2-3 hours after eating.  Avoid eating meals 2-3 hours before bed.  Avoid drinking a lot of liquid with meals.  Cook foods using methods other than frying. Bake, grill, or broil food instead.  Avoid or limit: ? Chocolate. ? Peppermint or spearmint. ? Alcohol. ? Pepper. ? Black and decaffeinated coffee. ? Black and decaffeinated tea. ? Bubbly (carbonated) soft drinks. ? Caffeinated energy drinks and soft drinks.  Limit high-fat foods such as: ? Fatty meat or fried foods. ? Whole milk, cream, butter, or ice cream. ? Nuts and nut butters. ? Pastries, donuts, and sweets made with butter or shortening.  Avoid foods that cause symptoms. These foods may be different for everyone. Common foods that cause symptoms include: ? Tomatoes. ? Oranges, lemons, and limes. ? Peppers. ? Spicy food. ? Onions and garlic. ? Vinegar. Lifestyle  Maintain a healthy weight. Ask your doctor what weight is healthy for you. If you need to lose weight, work with your doctor to do so safely.  Exercise for at least 30 minutes for 5 or more days each week, or as told by your doctor.  Wear loose-fitting clothes.  Do not smoke. If you need help quitting, ask your doctor.  Sleep with the head of your bed higher than your feet. Use a wedge under the  mattress or blocks under the bed frame to raise the head of the bed. Summary  When you have gastroesophageal reflux disease (GERD), food and lifestyle choices are very important in easing your symptoms.  Eat small meals often instead of 3 large meals a day. Eat your meals slowly, and in a place where you are relaxed.  Limit high-fat foods such as fatty meat or fried foods.  Avoid bending over or lying down until 2-3 hours after eating.  Avoid peppermint and spearmint, caffeine, alcohol, and chocolate. This information is not intended to replace advice given to you by your health care provider. Make sure you discuss any questions you have with your health care provider. Document Released: 08/03/2011 Document Revised: 05/25/2018 Document Reviewed: 03/09/2016 Elsevier Patient Education  2020 [place] Maintenance for Postmenopausal Women Menopause is a normal process in which your ability to get pregnant comes to an end. This process happens slowly over many months or years, usually between the ages of 58 and 36. Menopause is complete when you have missed your menstrual periods for 12 months. It is important to talk with your health care provider about some of the most common conditions that affect women after menopause (postmenopausal women). These include heart disease, cancer, and bone loss (osteoporosis). Adopting a healthy lifestyle and getting preventive care can help to promote your health and wellness. The actions you take can also lower your chances of developing some of these common conditions. What should I know about menopause? During menopause, you may get  a number of symptoms, such as:  Hot flashes. These can be moderate or severe.  Night sweats.  Decrease in sex drive.  Mood swings.  Headaches.  Tiredness.  Irritability.  Memory problems.  Insomnia. Choosing to treat or not to treat these symptoms is a decision that you make with your health care  provider. Do I need hormone replacement therapy?  Hormone replacement therapy is effective in treating symptoms that are caused by menopause, such as hot flashes and night sweats.  Hormone replacement carries certain risks, especially as you become older. If you are thinking about using estrogen or estrogen with progestin, discuss the benefits and risks with your health care provider. What is my risk for heart disease and stroke? The risk of heart disease, heart attack, and stroke increases as you age. One of the causes may be a change in the body's hormones during menopause. This can affect how your body uses dietary fats, triglycerides, and cholesterol. Heart attack and stroke are medical emergencies. There are many things that you can do to help prevent heart disease and stroke. Watch your blood pressure  High blood pressure causes heart disease and increases the risk of stroke. This is more likely to develop in people who have high blood pressure readings, are of African descent, or are overweight.  Have your blood pressure checked: ? Every 3-5 years if you are 1-26 years of age. ? Every year if you are 67 years old or older. Eat a healthy diet   Eat a diet that includes plenty of vegetables, fruits, low-fat dairy products, and lean protein.  Do not eat a lot of foods that are high in solid fats, added sugars, or sodium. Get regular exercise Get regular exercise. This is one of the most important things you can do for your health. Most adults should:  Try to exercise for at least 150 minutes each week. The exercise should increase your heart rate and make you sweat (moderate-intensity exercise).  Try to do strengthening exercises at least twice each week. Do these in addition to the moderate-intensity exercise.  Spend less time sitting. Even light physical activity can be beneficial. Other tips  Work with your health care provider to achieve or maintain a healthy weight.  Do not  use any products that contain nicotine or tobacco, such as cigarettes, e-cigarettes, and chewing tobacco. If you need help quitting, ask your health care provider.  Know your numbers. Ask your health care provider to check your cholesterol and your blood sugar (glucose). Continue to have your blood tested as directed by your health care provider. Do I need screening for cancer? Depending on your health history and family history, you may need to have cancer screening at different stages of your life. This may include screening for:  Breast cancer.  Cervical cancer.  Lung cancer.  Colorectal cancer. What is my risk for osteoporosis? After menopause, you may be at increased risk for osteoporosis. Osteoporosis is a condition in which bone destruction happens more quickly than new bone creation. To help prevent osteoporosis or the bone fractures that can happen because of osteoporosis, you may take the following actions:  If you are 48-22 years old, get at least 1,000 mg of calcium and at least 600 mg of vitamin D per day.  If you are older than age 25 but younger than age 67, get at least 1,200 mg of calcium and at least 600 mg of vitamin D per day.  If you are older  than age 95, get at least 1,200 mg of calcium and at least 800 mg of vitamin D per day. Smoking and drinking excessive alcohol increase the risk of osteoporosis. Eat foods that are rich in calcium and vitamin D, and do weight-bearing exercises several times each week as directed by your health care provider. How does menopause affect my mental health? Depression may occur at any age, but it is more common as you become older. Common symptoms of depression include:  Low or sad mood.  Changes in sleep patterns.  Changes in appetite or eating patterns.  Feeling an overall lack of motivation or enjoyment of activities that you previously enjoyed.  Frequent crying spells. Talk with your health care provider if you think that  you are experiencing depression. General instructions See your health care provider for regular wellness exams and vaccines. This may include:  Scheduling regular health, dental, and eye exams.  Getting and maintaining your vaccines. These include: ? Influenza vaccine. Get this vaccine each year before the flu season begins. ? Pneumonia vaccine. ? Shingles vaccine. ? Tetanus, diphtheria, and pertussis (Tdap) booster vaccine. Your health care provider may also recommend other immunizations. Tell your health care provider if you have ever been abused or do not feel safe at home. Summary  Menopause is a normal process in which your ability to get pregnant comes to an end.  This condition causes hot flashes, night sweats, decreased interest in sex, mood swings, headaches, or lack of sleep.  Treatment for this condition may include hormone replacement therapy.  Take actions to keep yourself healthy, including exercising regularly, eating a healthy diet, watching your weight, and checking your blood pressure and blood sugar levels.  Get screened for cancer and depression. Make sure that you are up to date with all your vaccines. This information is not intended to replace advice given to you by your health care provider. Make sure you discuss any questions you have with your health care provider. Document Released: 03/26/2005 Document Revised: 01/25/2018 Document Reviewed: 01/25/2018 Elsevier Patient Education  2020 [employer].

## 2018-09-19 ENCOUNTER — Ambulatory Visit: Payer: 59

## 2018-09-19 LAB — CBC WITH DIFFERENTIAL/PLATELET
Absolute Monocytes: 494 cells/uL (ref 200–950)
Basophils Absolute: 20 cells/uL (ref 0–200)
Basophils Relative: 0.3 %
Eosinophils Absolute: 254 cells/uL (ref 15–500)
Eosinophils Relative: 3.9 %
HCT: 41.2 % (ref 35.0–45.0)
Hemoglobin: 13.7 g/dL (ref 11.7–15.5)
Lymphs Abs: 2191 cells/uL (ref 850–3900)
MCH: 26.9 pg — ABNORMAL LOW (ref 27.0–33.0)
MCHC: 33.3 g/dL (ref 32.0–36.0)
MCV: 80.9 fL (ref 80.0–100.0)
MPV: 11.2 fL (ref 7.5–12.5)
Monocytes Relative: 7.6 %
Neutro Abs: 3543 cells/uL (ref 1500–7800)
Neutrophils Relative %: 54.5 %
Platelets: 310 10*3/uL (ref 140–400)
RBC: 5.09 10*6/uL (ref 3.80–5.10)
RDW: 14.2 % (ref 11.0–15.0)
Total Lymphocyte: 33.7 %
WBC: 6.5 10*3/uL (ref 3.8–10.8)

## 2018-09-19 LAB — COMPREHENSIVE METABOLIC PANEL
AG Ratio: 1.6 (calc) (ref 1.0–2.5)
ALT: 35 U/L — ABNORMAL HIGH (ref 6–29)
AST: 23 U/L (ref 10–35)
Albumin: 4.7 g/dL (ref 3.6–5.1)
Alkaline phosphatase (APISO): 62 U/L (ref 37–153)
BUN: 12 mg/dL (ref 7–25)
CO2: 24 mmol/L (ref 20–32)
Calcium: 10.1 mg/dL (ref 8.6–10.4)
Chloride: 103 mmol/L (ref 98–110)
Creat: 1.04 mg/dL (ref 0.50–1.05)
Globulin: 2.9 g/dL (calc) (ref 1.9–3.7)
Glucose, Bld: 100 mg/dL — ABNORMAL HIGH (ref 65–99)
Potassium: 4.3 mmol/L (ref 3.5–5.3)
Sodium: 139 mmol/L (ref 135–146)
Total Bilirubin: 0.6 mg/dL (ref 0.2–1.2)
Total Protein: 7.6 g/dL (ref 6.1–8.1)

## 2018-09-19 LAB — LIPID PANEL
Cholesterol: 200 mg/dL — ABNORMAL HIGH (ref <200)
HDL: 39 mg/dL — ABNORMAL LOW (ref >=50)
LDL Cholesterol (Calc): 137 mg/dL (calc) — ABNORMAL HIGH
Non-HDL Cholesterol (Calc): 161 mg/dL (calc) — ABNORMAL HIGH (ref <130)
Total CHOL/HDL Ratio: 5.1 (calc) — ABNORMAL HIGH (ref <5.0)
Triglycerides: 118 mg/dL (ref <150)

## 2018-09-19 LAB — HEMOGLOBIN A1C
Hgb A1c MFr Bld: 6.6 % of total Hgb — ABNORMAL HIGH (ref <5.7)
Mean Plasma Glucose: 143 (calc)
eAG (mmol/L): 7.9 (calc)

## 2018-09-20 LAB — URINALYSIS, COMPLETE W/RFL CULTURE
Bacteria, UA: NONE SEEN /HPF
Bilirubin Urine: NEGATIVE
Glucose, UA: NEGATIVE
Hgb urine dipstick: NEGATIVE
Hyaline Cast: NONE SEEN /LPF
Ketones, ur: NEGATIVE
Leukocyte Esterase: NEGATIVE
Nitrites, Initial: NEGATIVE
Protein, ur: NEGATIVE
Specific Gravity, Urine: 1.014 (ref 1.001–1.03)
Squamous Epithelial / LPF: NONE SEEN /HPF (ref <=5)
WBC, UA: NONE SEEN /HPF (ref 0–5)
pH: <=5 (ref 5.0–8.0)

## 2018-09-20 LAB — URINE CULTURE
MICRO NUMBER:: 734447
SPECIMEN QUALITY:: ADEQUATE

## 2018-09-20 LAB — CULTURE INDICATED

## 2018-09-28 ENCOUNTER — Ambulatory Visit
Admission: RE | Admit: 2018-09-28 | Discharge: 2018-09-28 | Disposition: A | Discharge status: Home / Self Care | Payer: 59 | Source: Ambulatory Visit | Attending: Family Medicine | Admitting: Family Medicine

## 2018-09-28 ENCOUNTER — Other Ambulatory Visit: Payer: Self-pay

## 2018-09-28 DIAGNOSIS — Z1231 Encounter for screening mammogram for malignant neoplasm of breast: Secondary | ICD-10-CM

## 2018-09-29 ENCOUNTER — Encounter: Payer: Self-pay | Admitting: Women's Health

## 2018-10-13 ENCOUNTER — Other Ambulatory Visit: Payer: Self-pay | Admitting: Sports Medicine

## 2018-10-13 DIAGNOSIS — M25562 Pain in left knee: Secondary | ICD-10-CM

## 2018-10-28 ENCOUNTER — Other Ambulatory Visit: Payer: Self-pay

## 2018-10-28 ENCOUNTER — Ambulatory Visit
Admission: RE | Admit: 2018-10-28 | Discharge: 2018-10-28 | Disposition: A | Discharge status: Home / Self Care | Payer: 59 | Source: Ambulatory Visit | Attending: Sports Medicine | Admitting: Sports Medicine

## 2018-10-28 DIAGNOSIS — M25562 Pain in left knee: Secondary | ICD-10-CM

## 2018-11-23 ENCOUNTER — Ambulatory Visit: Payer: 59 | Admitting: *Deleted

## 2018-11-23 ENCOUNTER — Other Ambulatory Visit: Payer: Self-pay

## 2018-11-23 DIAGNOSIS — R600 Localized edema: Secondary | ICD-10-CM

## 2018-11-23 NOTE — Progress Notes (Signed)
Measured Tamara Brennan for knee high closed toed compression hose.  Ankle 29 cm calf 42 cm.  Medivan assure size XL.  Color black  Put hose on Tamara Brennan to assure proper fitting.   Reviewed care of hose with Tamara Brennan.

## 2018-12-29 ENCOUNTER — Other Ambulatory Visit: Payer: Self-pay

## 2018-12-29 DIAGNOSIS — Z20822 Contact with and (suspected) exposure to covid-19: Secondary | ICD-10-CM

## 2018-12-31 LAB — NOVEL CORONAVIRUS, NAA: SARS-CoV-2, NAA: NOT DETECTED

## 2019-04-14 ENCOUNTER — Ambulatory Visit: Payer: 59 | Attending: Internal Medicine

## 2019-04-14 DIAGNOSIS — Z23 Encounter for immunization: Secondary | ICD-10-CM | POA: Insufficient documentation

## 2019-04-14 NOTE — Progress Notes (Signed)
   Covid-19 Vaccination Clinic  Name:  Tamara Brennan    MRN: [id number] DOB: 1960-11-27  04/14/2019  [name] was observed post Covid-19 immunization for 15 minutes without incidence. She was provided with Vaccine Information Sheet and instruction to access the V-Safe system.   [name] was instructed to call 911 with any severe reactions post vaccine: Marland Kitchen Difficulty breathing  . Swelling of your face and throat  . A fast heartbeat  . A bad rash all over your body  . Dizziness and weakness    Immunizations Administered    Name Date Dose VIS Date Route   Pfizer COVID-19 Vaccine 04/14/2019  9:35 AM 0.3 mL 01/26/2019 Intramuscular   Manufacturer: [place]   Lot: [id number]   [place]: [id number]

## 2019-05-05 ENCOUNTER — Ambulatory Visit: Payer: 59 | Attending: Internal Medicine

## 2019-05-05 DIAGNOSIS — Z23 Encounter for immunization: Secondary | ICD-10-CM

## 2019-05-05 NOTE — Progress Notes (Signed)
   Covid-19 Vaccination Clinic  Name:  Tamara Brennan    MRN: [id number] DOB: 1960-07-16  05/05/2019  [name] was observed post Covid-19 immunization for 15 minutes without incident. She was provided with Vaccine Information Sheet and instruction to access the V-Safe system.   [name] was instructed to call 911 with any severe reactions post vaccine: Marland Kitchen Difficulty breathing  . Swelling of face and throat  . A fast heartbeat  . A bad rash all over body  . Dizziness and weakness   Immunizations Administered    Name Date Dose VIS Date Route   Pfizer COVID-19 Vaccine 05/05/2019  9:09 AM 0.3 mL 01/26/2019 Intramuscular   Manufacturer: [place]   Lot: [id number]   [place]: [id number]

## 2019-05-09 ENCOUNTER — Ambulatory Visit: Payer: 59

## 2019-08-13 ENCOUNTER — Other Ambulatory Visit: Payer: Self-pay | Admitting: Family Medicine

## 2019-08-13 DIAGNOSIS — Z1231 Encounter for screening mammogram for malignant neoplasm of breast: Secondary | ICD-10-CM

## 2019-09-19 ENCOUNTER — Encounter: Payer: 59 | Admitting: Nurse Practitioner

## 2019-10-01 ENCOUNTER — Other Ambulatory Visit: Payer: Self-pay

## 2019-10-01 ENCOUNTER — Ambulatory Visit
Admission: RE | Admit: 2019-10-01 | Discharge: 2019-10-01 | Disposition: A | Discharge status: Home / Self Care | Payer: 59 | Source: Ambulatory Visit | Attending: Family Medicine | Admitting: Family Medicine

## 2019-10-01 DIAGNOSIS — Z1231 Encounter for screening mammogram for malignant neoplasm of breast: Secondary | ICD-10-CM

## 2019-11-08 ENCOUNTER — Other Ambulatory Visit: Payer: Self-pay

## 2019-11-08 ENCOUNTER — Ambulatory Visit: Payer: 59 | Admitting: Nurse Practitioner

## 2019-11-08 ENCOUNTER — Encounter: Payer: Self-pay | Admitting: Nurse Practitioner

## 2019-11-08 ENCOUNTER — Encounter: Payer: 59 | Admitting: Nurse Practitioner

## 2019-11-08 VITALS — BP 122/80 | Ht 67.0 in | Wt 219.0 lb

## 2019-11-08 DIAGNOSIS — E890 Postprocedural hypothyroidism: Secondary | ICD-10-CM

## 2019-11-08 DIAGNOSIS — Z90710 Acquired absence of both cervix and uterus: Secondary | ICD-10-CM

## 2019-11-08 DIAGNOSIS — E559 Vitamin D deficiency, unspecified: Secondary | ICD-10-CM

## 2019-11-08 DIAGNOSIS — Z01419 Encounter for gynecological examination (general) (routine) without abnormal findings: Secondary | ICD-10-CM

## 2019-11-08 DIAGNOSIS — Z9009 Acquired absence of other part of head and neck: Secondary | ICD-10-CM

## 2019-11-08 NOTE — Patient Instructions (Signed)

## 2019-11-08 NOTE — Progress Notes (Signed)
   Tamara Brennan October 24, 1960 [id number]   History:  59 y.o. [id number] presents for annual exam. 1995 TVH for fibroids, was on Estrace 0.5 mg daily for management of hot flashes but has stopped taking these and doing fine. Normal pap and mammogram history. History of subtotal thyroidectomy, vitamin D deficiency.   Gynecologic History No LMP recorded. Patient has had a hysterectomy.   Last Pap: 2013. Results were: normal Last mammogram: 10/03/2019. Results were: normal Last colonoscopy: 2013. Results were: normal Last Dexa: 08/01/2018. Results were: normal  Past medical history, past surgical history, family history and social history were all reviewed and documented in the EPIC chart.  ROS:  A ROS was performed and pertinent positives and negatives are included.  Exam:  Vitals:   11/08/19 1024  BP: 122/80  Weight: 219 lb (99.3 kg)  Height: 5\' 7"  (1.702 m)   Body mass index is 34.3 kg/m.  General appearance:  Normal Thyroid:  Symmetrical, normal in size, without palpable masses or nodularity. Respiratory  Auscultation:  Clear without wheezing or rhonchi Cardiovascular  Auscultation:  Regular rate, without rubs, murmurs or gallops  Edema/varicosities:  Not grossly evident Abdominal  Soft,nontender, without masses, guarding or rebound.  Liver/spleen:  No organomegaly noted  Hernia:  None appreciated  Skin  Inspection:  Grossly normal   Breasts: Examined lying and sitting.   Right: Without masses, retractions, discharge or axillary adenopathy.   Left: Without masses, retractions, discharge or axillary adenopathy. Gentitourinary   Inguinal/mons:  Normal without inguinal adenopathy  External genitalia:  Normal  BUS/Urethra/Skene's glands:  Normal  Vagina:  Normal  Cervix:  Absent  Uterus:  Absent  Adnexa/parametria:     Rt: Without masses or tenderness.   Lt: Without masses or tenderness.  Anus and perineum: Normal  Digital rectal exam: Normal sphincter tone without  palpated masses or tenderness  Assessment/Plan:  59 y.o. [id number] for annual exam.   Well female exam with routine gynecological exam - Plan: CBC with Differential/Platelet, Comprehensive metabolic panel, Lipid panel. Education provided on SBEs, importance of preventative screenings, current guidelines, high calcium diet, regular exercise, and multivitamin daily.   Screening for cervical cancer - normal pap history. Most recent in 2013. We agreed to repeat at 10 year interval and stop per guidelines. We will reevaluate on an annual basis.   Vitamin D deficiency - Plan: VITAMIN D 25 Hydroxy (Vit-D Deficiency, Fractures). Most recent in 2018 - 29. Taking daily supplement.   History of subtotal thyroidectomy - Plan: TSH.   Follow up in 1 year for annual.     Tamara Brennan [facility], 10:34 AM 11/08/2019

## 2019-11-09 LAB — COMPREHENSIVE METABOLIC PANEL
AG Ratio: 1.8 (calc) (ref 1.0–2.5)
ALT: 20 U/L (ref 6–29)
AST: 19 U/L (ref 10–35)
Albumin: 4.8 g/dL (ref 3.6–5.1)
Alkaline phosphatase (APISO): 60 U/L (ref 37–153)
BUN: 11 mg/dL (ref 7–25)
CO2: 23 mmol/L (ref 20–32)
Calcium: 9.7 mg/dL (ref 8.6–10.4)
Chloride: 104 mmol/L (ref 98–110)
Creat: 0.97 mg/dL (ref 0.50–1.05)
Globulin: 2.7 g/dL (calc) (ref 1.9–3.7)
Glucose, Bld: 91 mg/dL (ref 65–99)
Potassium: 4.3 mmol/L (ref 3.5–5.3)
Sodium: 138 mmol/L (ref 135–146)
Total Bilirubin: 0.6 mg/dL (ref 0.2–1.2)
Total Protein: 7.5 g/dL (ref 6.1–8.1)

## 2019-11-09 LAB — CBC WITH DIFFERENTIAL/PLATELET
Absolute Monocytes: 443 cells/uL (ref 200–950)
Basophils Absolute: 18 cells/uL (ref 0–200)
Basophils Relative: 0.3 %
Eosinophils Absolute: 189 cells/uL (ref 15–500)
Eosinophils Relative: 3.2 %
HCT: 41.2 % (ref 35.0–45.0)
Hemoglobin: 13.7 g/dL (ref 11.7–15.5)
Lymphs Abs: 2089 cells/uL (ref 850–3900)
MCH: 27 pg (ref 27.0–33.0)
MCHC: 33.3 g/dL (ref 32.0–36.0)
MCV: 81.1 fL (ref 80.0–100.0)
MPV: 11 fL (ref 7.5–12.5)
Monocytes Relative: 7.5 %
Neutro Abs: 3162 cells/uL (ref 1500–7800)
Neutrophils Relative %: 53.6 %
Platelets: 319 10*3/uL (ref 140–400)
RBC: 5.08 10*6/uL (ref 3.80–5.10)
RDW: 13.8 % (ref 11.0–15.0)
Total Lymphocyte: 35.4 %
WBC: 5.9 10*3/uL (ref 3.8–10.8)

## 2019-11-09 LAB — LIPID PANEL
Cholesterol: 180 mg/dL (ref <200)
HDL: 42 mg/dL — ABNORMAL LOW (ref >=50)
LDL Cholesterol (Calc): 120 mg/dL (calc) — ABNORMAL HIGH
Non-HDL Cholesterol (Calc): 138 mg/dL (calc) — ABNORMAL HIGH (ref <130)
Total CHOL/HDL Ratio: 4.3 (calc) (ref <5.0)
Triglycerides: 82 mg/dL (ref <150)

## 2019-11-09 LAB — TSH: TSH: 2.21 mIU/L (ref 0.40–4.50)

## 2019-11-09 LAB — VITAMIN D 25 HYDROXY (VIT D DEFICIENCY, FRACTURES): Vit D, 25-Hydroxy: 39 ng/mL (ref 30–100)

## 2020-06-02 ENCOUNTER — Other Ambulatory Visit: Payer: Self-pay

## 2020-06-02 ENCOUNTER — Ambulatory Visit: Payer: 59 | Admitting: Podiatry

## 2020-06-02 ENCOUNTER — Other Ambulatory Visit: Payer: Self-pay | Admitting: Podiatry

## 2020-06-02 ENCOUNTER — Encounter: Payer: Self-pay | Admitting: Podiatry

## 2020-06-02 ENCOUNTER — Ambulatory Visit (INDEPENDENT_AMBULATORY_CARE_PROVIDER_SITE_OTHER): Payer: 59

## 2020-06-02 DIAGNOSIS — M79672 Pain in left foot: Secondary | ICD-10-CM

## 2020-06-02 DIAGNOSIS — M7672 Peroneal tendinitis, left leg: Secondary | ICD-10-CM | POA: Diagnosis not present

## 2020-06-02 DIAGNOSIS — M79671 Pain in right foot: Secondary | ICD-10-CM

## 2020-06-02 DIAGNOSIS — M7671 Peroneal tendinitis, right leg: Secondary | ICD-10-CM

## 2020-06-02 DIAGNOSIS — M7670 Peroneal tendinitis, unspecified leg: Secondary | ICD-10-CM

## 2020-06-02 DIAGNOSIS — M2041 Other hammer toe(s) (acquired), right foot: Secondary | ICD-10-CM

## 2020-06-02 MED ORDER — TRIAMCINOLONE ACETONIDE 10 MG/ML IJ SUSP
10.0000 mg | Freq: Once | INTRAMUSCULAR | Status: AC
  Administered 2020-06-02: 10 mg

## 2020-06-02 NOTE — Progress Notes (Signed)
Subjective:   Patient ID: Tamara Brennan, female   DOB: 60 y.o.   MRN: [id number]   HPI Patient presents stating she is developed a lot of pain on the outside of her left foot and also has developed a painful corn on the fifth digit right foot that makes shoe gear difficult.  Has had previous bunion done years ago and patient states she does not remember injury.  Patient does not smoke   ROS      Objective:  Physical Exam  Neurovascular status intact exquisite discomfort peroneal tendon insertion into the base of the fifth metatarsal left with no indication of muscle strength loss and also noted to have a keratotic lesion with pain to the head of the proximal phalanx of digit 5 right with pain     Assessment:  Peroneal tendinitis left along with hammertoe deformity fifth digit right     Plan:  H&P discussed both problems separately and for the left foot I did do sterile prep and injected the tendon insertion 3 mg dexamethasone Kenalog 5 mg Xylocaine applied fascial brace to lift up the outside of the foot advised on ice diclofenac gel and then educated her on hammertoe deformity fifth right and the consideration for arthroplasty procedure in order to correct underlying deformity  X-rays indicated no signs of pathology around base of fifth metatarsal left with moderate rotation fifth digit right and pins in the first metatarsal bilateral from previous surgery

## 2020-06-20 ENCOUNTER — Other Ambulatory Visit: Payer: Self-pay | Admitting: *Deleted

## 2020-06-20 DIAGNOSIS — I8393 Asymptomatic varicose veins of bilateral lower extremities: Secondary | ICD-10-CM

## 2020-06-20 DIAGNOSIS — R600 Localized edema: Secondary | ICD-10-CM

## 2020-07-10 ENCOUNTER — Ambulatory Visit (HOSPITAL_COMMUNITY)
Admission: RE | Admit: 2020-07-10 | Discharge: 2020-07-10 | Disposition: A | Discharge status: Home / Self Care | Payer: 59 | Source: Ambulatory Visit | Attending: Vascular Surgery | Admitting: Vascular Surgery

## 2020-07-10 ENCOUNTER — Other Ambulatory Visit: Payer: Self-pay

## 2020-07-10 ENCOUNTER — Ambulatory Visit: Payer: 59 | Admitting: Vascular Surgery

## 2020-07-10 ENCOUNTER — Encounter: Payer: Self-pay | Admitting: Vascular Surgery

## 2020-07-10 VITALS — BP 132/91 | HR 87 | Temp 98.0°F | Resp 16 | Ht 68.0 in | Wt 215.6 lb

## 2020-07-10 DIAGNOSIS — I8393 Asymptomatic varicose veins of bilateral lower extremities: Secondary | ICD-10-CM | POA: Diagnosis not present

## 2020-07-10 DIAGNOSIS — R600 Localized edema: Secondary | ICD-10-CM

## 2020-07-10 DIAGNOSIS — I83813 Varicose veins of bilateral lower extremities with pain: Secondary | ICD-10-CM

## 2020-07-10 NOTE — Progress Notes (Signed)
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  The patient is self-referred.  ASSESSMENT & PLAN:   CHRONIC VENOUS DISEASE: This patient has CEAP C1 venous disease.  She has spider veins in both thighs.  She has no significant deep venous reflux or superficial venous reflux and no evidence of deep venous obstruction.  We have discussed the importance of intermittent leg elevation and the proper positioning for this.  In addition I encouraged her to continue to wear her knee-high compression stockings especially when she is on her feet or sitting for prolonged period of time.  We discussed the importance of exercise specifically walking and water aerobics.  I encouraged her to avoid prolonged sitting and standing.  We also discussed the importance of maintaining a healthy weight as central obesity especially increases lower extremity venous pressure.  If her venous symptoms progress in the future then certainly we could reevaluate her.  However currently she has no indication for any more invasive work-up or procedures related to her venous disease.  Deitra Mayo, MD Office: [phone]   HPI:   Tamara Brennan is a 60 y.o. (1961-02-04) female who presents for evaluation of spider veins of both lower extremities.  This patient states that she developed some spider veins in both legs over 3 years ago.  She does get some aching pain associated with these when she is standing for prolonged period of time.  She works as a Pharmacist, hospital.  She has had no large truncal varicosities.  She has had some mild swelling in the past but none recently.  Venous symptoms include: positive if (X) [x  ] aching [  ] heavy [  ] tired  [  ] throbbing [  ] burning  [  ] itching [x  ]swelling [  ] bleeding [  ] ulcer  Onset/duration: Greater than 3 years ago. Occupation: Pharmacist, hospital Aggravating factors: sitting & standing Alleviating factors: elevation Compression: Yes helps: Yes Pain  medications: No Previous vein procedures: No History of DVT: No  Past Medical History:  Diagnosis Date  . STD (sexually transmitted disease)    HSV ll    Past Surgical History:  Procedure Laterality Date  . BREAST SURGERY     Reduction  . CESAREAN SECTION    . CHOLECYSTECTOMY    . FOOT SURGERY    . PARATHYROIDECTOMY    . REDUCTION MAMMAPLASTY Bilateral   . TUBAL LIGATION    . VAGINAL HYSTERECTOMY      Social History   Socioeconomic History  . Marital status: Married    Spouse name: Not on file  . Number of children: Not on file  . Years of education: Not on file  . Highest education level: Not on file  Occupational History  . Not on file  Tobacco Use  . Smoking status: Never Smoker  . Smokeless tobacco: Never Used  Vaping Use  . Vaping Use: Never used  Substance and Sexual Activity  . Alcohol use: No    Alcohol/week: 0.0 standard drinks  . Drug use: No  . Sexual activity: Yes    Birth control/protection: Surgical, None    Comment: INTERCOURSE AGE 76, SEXUAL PARTNERS LESS THAN 5  Other Topics Concern  . Not on file  Social History Narrative  . Not on file   Social Determinants of Health   Financial Resource Strain: Not on file  Food Insecurity: Not on file  Transportation Needs: Not on file  Physical  Activity: Not on file  Stress: Not on file  Social Connections: Not on file  Intimate Partner Violence: Not on file    Family History  Problem Relation Age of Onset  . Heart disease Mother   . Hypertension Sister   . Diabetes Maternal Aunt     Current Outpatient Medications  Medication Sig Dispense Refill  . CALCIUM PO Take 1 tablet by mouth daily.    . Cholecalciferol (VITAMIN D3 PO) Take 1 tablet by mouth daily.    . Multiple Vitamin (MULTIVITAMIN) tablet Take 1 tablet by mouth daily.    . Probiotic Product (PROBIOTIC ADVANCED PO) Take by mouth.    . TURMERIC PO Take 1 tablet by mouth daily.    . valACYclovir (VALTREX) 500 MG tablet TAKE 1  TABLET BY MOUTH TWICE A DAY FOR 3 TO 5 DAYS AS NEEDED 90 tablet 2   No current facility-administered medications for this visit.    Allergies  Allergen Reactions  . Latex Other (See Comments)    other     REVIEW OF SYSTEMS:  [X]  denotes positive finding, [ ]  denotes negative finding Cardiac  Comments:  Chest pain or chest pressure:    Shortness of breath upon exertion:    Short of breath when lying flat:    Irregular heart rhythm:        Vascular    Pain in calf, thigh, or hip brought on by ambulation:    Pain in feet at night that wakes you up from your sleep:     Blood clot in your veins:    Leg swelling:         Pulmonary    Oxygen at home:    Productive cough:     Wheezing:         Neurologic    Sudden weakness in arms or legs:     Sudden numbness in arms or legs:     Sudden onset of difficulty speaking or slurred speech:    Temporary loss of vision in one eye:     Problems with dizziness:         Gastrointestinal    Blood in stool:     Vomited blood:         Genitourinary    Burning when urinating:     Blood in urine:        Psychiatric    Major depression:         Hematologic    Bleeding problems:    Problems with blood clotting too easily:        Skin    Rashes or ulcers:        Constitutional    Fever or chills:     PHYSICAL EXAM:   Vitals:   07/10/20 1449  BP: (!) 132/91  Pulse: 87  Resp: 16  Temp: 98 F (36.7 C)  TempSrc: Temporal  SpO2: 98%  Weight: 215 lb 9.6 oz (97.8 kg)  Height: 5\' 8"  (1.727 m)    Body mass index is 32.78 kg/m.  GENERAL: The patient is a well-nourished female, in no acute distress. The vital signs are documented above. CARDIAC: There is a regular rate and rhythm.  VASCULAR: I do not detect carotid bruits She has palpable dorsalis pedis pulses. She has a biphasic dorsalis pedis and posterior tibial signal bilaterally. She has spider veins in both legs in the thigh as documented in the mini photographs  below.  PULMONARY: There is good air exchange bilaterally without wheezing or rales. ABDOMEN: Soft and non-tender with normal pitched bowel sounds.  MUSCULOSKELETAL: There are no major deformities or cyanosis. NEUROLOGIC: No focal weakness or paresthesias are detected. SKIN: There are no ulcers or rashes noted. PSYCHIATRIC: The patient has a normal affect.  DATA:    VENOUS DUPLEX: I have independently interpreted her venous reflux study today.  On the right side there is no evidence of DVT or superficial venous thrombosis.  There is no deep venous reflux.  There is no superficial venous reflux.  On the left side there is no evidence of DVT or superficial venous thrombosis.  There is no deep venous reflux.  There is no superficial venous reflux.

## 2020-07-29 ENCOUNTER — Telehealth: Payer: Self-pay

## 2020-07-29 NOTE — Telephone Encounter (Signed)
Discussed sclerotherapy with pt. At this time due to summer heat, she is wanting to schedule treatment in the fall and will call back closer to that time. No further questions/concerns at this time.

## 2020-07-30 ENCOUNTER — Telehealth: Payer: Self-pay | Admitting: *Deleted

## 2020-07-30 NOTE — Telephone Encounter (Signed)
Left telephone voice message that special order compression hose had arrived and that Mrs. Clary could pick them up at front desk/receptionist without an appointment.

## 2020-08-05 DIAGNOSIS — M7989 Other specified soft tissue disorders: Secondary | ICD-10-CM

## 2020-08-11 ENCOUNTER — Other Ambulatory Visit: Payer: Self-pay | Admitting: Family Medicine

## 2020-08-11 DIAGNOSIS — Z1231 Encounter for screening mammogram for malignant neoplasm of breast: Secondary | ICD-10-CM

## 2020-10-02 ENCOUNTER — Other Ambulatory Visit: Payer: Self-pay

## 2020-10-02 ENCOUNTER — Ambulatory Visit
Admission: RE | Admit: 2020-10-02 | Discharge: 2020-10-02 | Disposition: A | Discharge status: Home / Self Care | Payer: 59 | Source: Ambulatory Visit | Attending: Family Medicine | Admitting: Family Medicine

## 2020-10-02 DIAGNOSIS — Z1231 Encounter for screening mammogram for malignant neoplasm of breast: Secondary | ICD-10-CM

## 2020-11-11 ENCOUNTER — Encounter: Payer: Self-pay | Admitting: Nurse Practitioner

## 2020-11-11 ENCOUNTER — Ambulatory Visit (INDEPENDENT_AMBULATORY_CARE_PROVIDER_SITE_OTHER): Payer: 59 | Admitting: Nurse Practitioner

## 2020-11-11 ENCOUNTER — Other Ambulatory Visit: Payer: Self-pay

## 2020-11-11 VITALS — BP 118/76 | Ht 67.0 in | Wt 221.0 lb

## 2020-11-11 DIAGNOSIS — E890 Postprocedural hypothyroidism: Secondary | ICD-10-CM

## 2020-11-11 DIAGNOSIS — Z8639 Personal history of other endocrine, nutritional and metabolic disease: Secondary | ICD-10-CM

## 2020-11-11 DIAGNOSIS — R600 Localized edema: Secondary | ICD-10-CM

## 2020-11-11 DIAGNOSIS — E7800 Pure hypercholesterolemia, unspecified: Secondary | ICD-10-CM

## 2020-11-11 DIAGNOSIS — Z01419 Encounter for gynecological examination (general) (routine) without abnormal findings: Secondary | ICD-10-CM | POA: Diagnosis not present

## 2020-11-11 DIAGNOSIS — B009 Herpesviral infection, unspecified: Secondary | ICD-10-CM

## 2020-11-11 MED ORDER — VALACYCLOVIR HCL 500 MG PO TABS: ORAL_TABLET | ORAL | 2 refills | Status: DC

## 2020-11-11 NOTE — Progress Notes (Signed)
Tamara Brennan 1961/02/01 [id number]   History:  60 y.o. A2N0539 presents for annual exam. 1995 TVH for fibroids, stopped ERT last year and doing fine. Normal pap and mammogram history. History of subtotal thyroidectomy, vitamin D deficiency. HSV - rare outbreaks, had one recently and had no medications. She complains of bilateral lower leg swelling that only occurs after being on her feet all day. She has been evaluated by vascular and told she did not have any vascular or lymphatic restrictions with recommendations for compression stockings, elevation, and exercise.   Gynecologic History No LMP recorded. Patient has had a hysterectomy.   Last Pap: 2013. Results were: normal Last mammogram: 10/02/2020. Results were: normal Last colonoscopy: 2013. Results were: normal Last Dexa: 08/01/2018. Results were: normal  Past medical history, past surgical history, family history and social history were all reviewed and documented in the EPIC chart. 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   ROS:  A ROS was performed and pertinent positives and negatives are included.  Exam:  Vitals:   11/11/20 1013  BP: 118/76  Weight: 221 lb (100.2 kg)  Height: 5\' 7"  (1.702 m)    Body mass index is 34.61 kg/m.  General appearance:  Normal Thyroid:  Symmetrical, normal in size, without palpable masses or nodularity. Respiratory  Auscultation:  Clear without wheezing or rhonchi Cardiovascular  Auscultation:  Regular rate, without rubs, murmurs or gallops  Edema/varicosities:  1+ BLE, spider veins present Abdominal  Soft,nontender, without masses, guarding or rebound.  Liver/spleen:  No organomegaly noted  Hernia:  None appreciated  Skin  Inspection:  Grossly normal   Breasts: Examined lying and sitting.   Right: Without masses, retractions, discharge or axillary adenopathy.   Left: Without masses, retractions, discharge or axillary adenopathy. Gentitourinary   Inguinal/mons:   Normal without inguinal adenopathy  External genitalia:  Normal  BUS/Urethra/Skene's glands:  Normal  Vagina:  Normal  Cervix:  Absent  Uterus:  Absent  Adnexa/parametria:     Rt: Without masses or tenderness.   Lt: Without masses or tenderness.  Anus and perineum: Normal  Digital rectal exam: Normal sphincter tone without palpated masses or tenderness  Assessment/Plan:  60 y.o. Tamara Brennan for annual exam.   Well female exam with routine gynecological exam - Plan: CBC with Differential/Platelet, Comprehensive metabolic panel. Education provided on SBEs, importance of preventative screenings, current guidelines, high calcium diet, regular exercise, and multivitamin daily.   History of vitamin D deficiency - Plan: VITAMIN D 25 Hydroxy (Vit-D Deficiency, Fractures)  History of subtotal thyroidectomy - Plan: TSH  Elevated LDL cholesterol level - Plan: Lipid panel  HSV infection - Plan: valACYclovir (VALTREX) 500 MG tablet twice daily x 3-5 days at first sign of outbreak. Rare outbreaks but had one recently and did not have any medications.   Bilateral lower extremity edema - She complains of bilateral lower leg swelling that only occurs after being on her feet all day. She has been evaluated by vascular and told she did not have any vascular or lymphatic restrictions with recommendations for compression stockings, elevation, and exercise. Recommend following up with PCP or vascular for further evaluation as she is worried it is her heart - mother deceased from MI. . Continue instructions for management provided by vascular.   Screening for cervical cancer - Normal Pap history. No longer screening per guidelines.   Screening for breast cancer - Normal mammogram history.  Continue annual screenings.  Normal breast exam today.  Screening for colon  cancer - 2013 colonoscopy? She plans to call and see when she is due for repeat.  Screening for osteoporosis - Normal bone density in 2020. Will  repeat at 5-year interval per recommendation.   Follow up in 1 year for annual.     Tamara Brennan [facility], 10:30 AM 11/11/2020

## 2020-11-12 ENCOUNTER — Encounter: Payer: Self-pay | Admitting: Nurse Practitioner

## 2021-01-28 ENCOUNTER — Other Ambulatory Visit: Payer: Self-pay | Admitting: Nurse Practitioner

## 2021-01-28 DIAGNOSIS — B009 Herpesviral infection, unspecified: Secondary | ICD-10-CM

## 2021-03-25 ENCOUNTER — Ambulatory Visit: Payer: 59 | Admitting: Vascular Surgery

## 2021-03-25 ENCOUNTER — Encounter (HOSPITAL_COMMUNITY): Payer: 59

## 2021-04-17 ENCOUNTER — Other Ambulatory Visit: Payer: Self-pay

## 2021-04-17 ENCOUNTER — Ambulatory Visit (INDEPENDENT_AMBULATORY_CARE_PROVIDER_SITE_OTHER): Payer: 59

## 2021-04-17 DIAGNOSIS — M7989 Other specified soft tissue disorders: Secondary | ICD-10-CM

## 2021-04-17 DIAGNOSIS — I8393 Asymptomatic varicose veins of bilateral lower extremities: Secondary | ICD-10-CM

## 2021-04-17 NOTE — Progress Notes (Signed)
Treated pt's bilateral LE spider veins and small reticular veins with Asclera 1% administered with a 27g butterfly.  Patient received a total of 4 mL. Pt tolerated well. Easy access. Anticipate good outcome. Pt was given post care instructions on both handout and verbally. Will follow prn. ? ?Photos: Yes.    Compression stockings applied: Yes.   ? ? ? ? ? ? ? ? ? ?

## 2021-08-04 ENCOUNTER — Ambulatory Visit: Payer: 59

## 2021-08-04 NOTE — Progress Notes (Signed)
Pt came in to have her BLE spider and reticular veins assessed s/p sclerotherapy 3 months ago. We looked at her previous documented pictures to compare what her treated veins looked like and some had visible improvement. Pt had one area on R upper thigh with some staining. We discussed this and she is interested in scheduling another treatment later this year.

## 2021-08-05 ENCOUNTER — Ambulatory Visit
Admission: RE | Admit: 2021-08-05 | Discharge: 2021-08-05 | Disposition: A | Discharge status: Home / Self Care | Payer: 59 | Source: Ambulatory Visit | Attending: Family Medicine | Admitting: Family Medicine

## 2021-08-05 ENCOUNTER — Other Ambulatory Visit: Payer: Self-pay | Admitting: Family Medicine

## 2021-08-05 DIAGNOSIS — M545 Low back pain, unspecified: Secondary | ICD-10-CM

## 2021-08-06 ENCOUNTER — Ambulatory Visit: Payer: 59 | Admitting: Podiatry

## 2021-09-23 ENCOUNTER — Encounter: Payer: Self-pay | Admitting: Podiatry

## 2021-09-23 ENCOUNTER — Ambulatory Visit: Payer: 59 | Admitting: Podiatry

## 2021-09-23 ENCOUNTER — Ambulatory Visit (INDEPENDENT_AMBULATORY_CARE_PROVIDER_SITE_OTHER): Payer: 59

## 2021-09-23 DIAGNOSIS — M7672 Peroneal tendinitis, left leg: Secondary | ICD-10-CM | POA: Diagnosis not present

## 2021-09-23 MED ORDER — TRIAMCINOLONE ACETONIDE 10 MG/ML IJ SUSP
10.0000 mg | Freq: Once | INTRAMUSCULAR | Status: AC
  Administered 2021-09-23: 10 mg

## 2021-09-24 NOTE — Progress Notes (Signed)
Subjective:   Patient ID: Tamara Brennan, female   DOB: 61 y.o.   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

## 2021-10-14 ENCOUNTER — Ambulatory Visit: Payer: 59 | Admitting: Podiatry

## 2021-10-21 ENCOUNTER — Other Ambulatory Visit: Payer: Self-pay | Admitting: Family Medicine

## 2021-10-21 DIAGNOSIS — Z1231 Encounter for screening mammogram for malignant neoplasm of breast: Secondary | ICD-10-CM

## 2021-11-12 ENCOUNTER — Encounter: Payer: Self-pay | Admitting: Nurse Practitioner

## 2021-11-12 ENCOUNTER — Ambulatory Visit (INDEPENDENT_AMBULATORY_CARE_PROVIDER_SITE_OTHER): Payer: 59 | Admitting: Nurse Practitioner

## 2021-11-12 VITALS — BP 122/80 | HR 70 | Resp 14 | Ht 67.0 in | Wt 218.0 lb

## 2021-11-12 DIAGNOSIS — Z01419 Encounter for gynecological examination (general) (routine) without abnormal findings: Secondary | ICD-10-CM | POA: Diagnosis not present

## 2021-11-12 DIAGNOSIS — Z780 Asymptomatic menopausal state: Secondary | ICD-10-CM

## 2021-11-12 DIAGNOSIS — B009 Herpesviral infection, unspecified: Secondary | ICD-10-CM

## 2021-11-12 NOTE — Progress Notes (Signed)
   Tamara Brennan 05-23-1960 [id number]   History:  61 y.o. [id number] presents for annual exam. Postmenopausal - no HRT (stopped a couple of years ago). S/P 1995 TVH for fibroids. Normal pap history. History of subtotal thyroidectomy, vitamin D deficiency. HSV - rare outbreaks.   Gynecologic History No LMP recorded. Patient has had a hysterectomy.   Contraception/Family planning: status post hysterectomy Sexually active: Yes  Health Maintenance Last Pap: 08/26/2011. Results were: Normal Last mammogram: 10/02/2020. Results were: Normal. Scheduled 10/6 Last colonoscopy: 2013. Results were: Normal Last Dexa: 08/01/2018. Results were: Normal  Past medical history, past surgical history, family history and social history were all reviewed and documented in the EPIC chart. 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   ROS:  A ROS was performed and pertinent positives and negatives are included.  Exam:  Vitals:   11/12/21 1055  BP: 122/80  Pulse: 70  Resp: 14  Weight: 218 lb (98.9 kg)  Height: '5\' 7"'$  (1.702 m)     Body mass index is 34.14 kg/m.  General appearance:  Normal Thyroid:  Symmetrical, normal in size, without palpable masses or nodularity. Respiratory  Auscultation:  Clear without wheezing or rhonchi Cardiovascular  Auscultation:  Regular rate, without rubs, murmurs or gallops  Edema/varicosities:  Not grossly evident Abdominal  Soft,nontender, without masses, guarding or rebound.  Liver/spleen:  No organomegaly noted  Hernia:  None appreciated  Skin  Inspection:  Grossly normal   Breasts: Examined lying and sitting.   Right: Without masses, retractions, discharge or axillary adenopathy.   Left: Without masses, retractions, discharge or axillary adenopathy. Genitourinary   Inguinal/mons:  Normal without inguinal adenopathy  External genitalia:  Normal appearing vulva with no masses, tenderness, or lesions  BUS/Urethra/Skene's  glands:  Normal  Vagina:  Normal appearing with normal color and discharge, no lesions  Cervix:  and uterus absent  Adnexa/parametria:     Rt: Normal in size, without masses or tenderness.   Lt: Normal in size, without masses or tenderness.  Anus and perineum: Normal  Digital rectal exam: Normal sphincter tone without palpated masses or tenderness  Patient informed chaperone available to be present for breast and pelvic exam. Patient has requested no chaperone to be present. Patient has been advised what will be completed during breast and pelvic exam.   Assessment/Plan:  61 y.o. [id number] for annual exam.   Well female exam with routine gynecological exam -Education provided on SBEs, importance of preventative screenings, current guidelines, high calcium diet, regular exercise, and multivitamin daily. Labs with PCP.   HSV infection - Valtrex as needed. Rare outbreaks.   Screening for cervical cancer - Normal Pap history. No longer screening per guidelines.   Screening for breast cancer - Normal mammogram history.  Continue annual screenings. Scheduled 10/6. Normal breast exam today.  Screening for colon cancer - 2013 colonoscopy. Consultation scheduled with GI in November.   Screening for osteoporosis - Normal bone density in 2020. Will repeat at age 51.   Follow up in 1 year for annual.     [name] [facility], 11:13 AM 11/12/2021

## 2021-11-20 ENCOUNTER — Ambulatory Visit
Admission: RE | Admit: 2021-11-20 | Discharge: 2021-11-20 | Disposition: A | Discharge status: Home / Self Care | Payer: 59 | Source: Ambulatory Visit | Attending: Family Medicine | Admitting: Family Medicine

## 2021-11-20 DIAGNOSIS — Z1231 Encounter for screening mammogram for malignant neoplasm of breast: Secondary | ICD-10-CM

## 2022-02-16 ENCOUNTER — Ambulatory Visit: Payer: 59 | Admitting: Nurse Practitioner

## 2022-02-16 VITALS — BP 130/82 | HR 85 | Wt 219.0 lb

## 2022-02-16 DIAGNOSIS — Z113 Encounter for screening for infections with a predominantly sexual mode of transmission: Secondary | ICD-10-CM | POA: Diagnosis not present

## 2022-02-16 DIAGNOSIS — N898 Other specified noninflammatory disorders of vagina: Secondary | ICD-10-CM

## 2022-02-16 LAB — WET PREP FOR TRICH, YEAST, CLUE

## 2022-02-16 NOTE — Progress Notes (Signed)
   Acute Office Visit  Subjective:    Patient ID: Tamara Brennan, female    DOB: [birth year], 62 y.o.   MRN: [id number]   HPI 62 y.o. presents today for vaginal itching x 2 weeks. Itching is internal and external. No discharge or odor. Has not tried anything OTC. No change in soaps or detergents.    Review of Systems  Constitutional: Negative.   Genitourinary:  Positive for vaginal pain (Itching). Negative for vaginal discharge.       Objective:    Physical Exam Constitutional:      Appearance: Normal appearance.  Genitourinary:    General: Normal vulva.     Vagina: Normal.     BP 130/82   Pulse 85   Wt 219 lb (99.3 kg)   SpO2 100%   BMI 34.30 kg/m  Wt Readings from Last 3 Encounters:  02/16/22 219 lb (99.3 kg)  11/12/21 218 lb (98.9 kg)  11/11/20 221 lb (100.2 kg)        Patient informed chaperone available to be present for breast and/or pelvic exam. Patient has requested no chaperone to be present. Patient has been advised what will be completed during breast and pelvic exam.   Wet prep negative for pathogens, moderate WBCs, many bacteria  Assessment & Plan:   Problem List Items Addressed This Visit   None Visit Diagnoses     Vaginal itching    -  Primary   Relevant Orders   WET PREP FOR [place], YEAST, CLUE   SureSwab Advanced Vaginitis Plus,TMA   Screen for STD (sexually transmitted disease)       Relevant Orders   SureSwab Advanced Vaginitis Plus,TMA      Plan: Wet prep negative. STD panel pending.      [name] DNP, 4:07 PM 02/16/2022

## 2022-02-17 LAB — SURESWAB® ADVANCED VAGINITIS PLUS,TMA
C. trachomatis RNA, TMA: NOT DETECTED
CANDIDA SPECIES: DETECTED — AB
Candida glabrata: NOT DETECTED
N. gonorrhoeae RNA, TMA: NOT DETECTED
SURESWAB(R) ADV BACTERIAL VAGINOSIS(BV),TMA: NEGATIVE
TRICHOMONAS VAGINALIS (TV),TMA: NOT DETECTED

## 2022-02-19 ENCOUNTER — Other Ambulatory Visit: Payer: Self-pay

## 2022-02-19 DIAGNOSIS — B379 Candidiasis, unspecified: Secondary | ICD-10-CM

## 2022-02-19 MED ORDER — FLUCONAZOLE 150 MG PO TABS: ORAL_TABLET | ORAL | 0 refills | Status: DC

## 2022-03-05 ENCOUNTER — Other Ambulatory Visit: Payer: Self-pay | Admitting: *Deleted

## 2022-03-05 DIAGNOSIS — B009 Herpesviral infection, unspecified: Secondary | ICD-10-CM

## 2022-03-05 MED ORDER — VALACYCLOVIR HCL 500 MG PO TABS: ORAL_TABLET | ORAL | 1 refills | Status: DC

## 2022-03-05 NOTE — Telephone Encounter (Signed)
Pt LVM requesting prescription be refilled.  Pt notified that rx was approved today and sent to pharmacy on file via detailed VM per DPR.

## 2022-11-15 ENCOUNTER — Ambulatory Visit: Payer: 59 | Admitting: Nurse Practitioner

## 2022-11-15 ENCOUNTER — Encounter: Payer: Self-pay | Admitting: Nurse Practitioner

## 2022-11-15 NOTE — Progress Notes (Deleted)
   Tamara Brennan 02-Sep-1960 [id number]   History:  62 y.o. F6O1308 presents for annual exam. Postmenopausal - no HRT. S/P 1995 TVH for fibroids. Normal pap history. History of subtotal thyroidectomy, vitamin D deficiency. HSV - rare outbreaks.   Gynecologic History No LMP recorded. Patient has had a hysterectomy.   Contraception/Family planning: status post hysterectomy Sexually active: Yes  Health Maintenance Last Pap: 08/26/2011. Results were: Normal Last mammogram: 11/20/2021. Results were: Normal Last colonoscopy: 2013. Results were: Normal Last Dexa: 08/01/2018. Results were: Normal  Past medical history, past surgical history, family history and social history were all reviewed and documented in the EPIC chart. 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   ROS:  A ROS was performed and pertinent positives and negatives are included.  Exam:  There were no vitals filed for this visit.    There is no height or weight on file to calculate BMI.  General appearance:  Normal Thyroid:  Symmetrical, normal in size, without palpable masses or nodularity. Respiratory  Auscultation:  Clear without wheezing or rhonchi Cardiovascular  Auscultation:  Regular rate, without rubs, murmurs or gallops  Edema/varicosities:  Not grossly evident Abdominal  Soft,nontender, without masses, guarding or rebound.  Liver/spleen:  No organomegaly noted  Hernia:  None appreciated  Skin  Inspection:  Grossly normal   Breasts: Examined lying and sitting.   Right: Without masses, retractions, discharge or axillary adenopathy.   Left: Without masses, retractions, discharge or axillary adenopathy. Pelvic: External genitalia:  no lesions              Urethra:  normal appearing urethra with no masses, tenderness or lesions              Bartholins and Skenes: normal                 Vagina: normal appearing vagina with normal color and discharge, no lesions               Cervix: absent Bimanual Exam:  Uterus: absent              Adnexa: no mass, fullness, tenderness              Rectovaginal: Deferred              Anus:  normal, no lesions  Patient informed chaperone available to be present for breast and pelvic exam. Patient has requested no chaperone to be present. Patient has been advised what will be completed during breast and pelvic exam.   Assessment/Plan:  62 y.o. [id number] for annual exam.   Well female exam with routine gynecological exam -Education provided on SBEs, importance of preventative screenings, current guidelines, high calcium diet, regular exercise, and multivitamin daily. Labs with PCP.   HSV infection - Valtrex as needed. Rare outbreaks.   Screening for cervical cancer - Normal Pap history. No longer screening per guidelines.   Screening for breast cancer - Normal mammogram history.  Continue annual screenings. Normal breast exam today.  Screening for colon cancer - 2013 colonoscopy. Consultation scheduled with GI in November.   Screening for osteoporosis - Normal bone density in 2020. Will repeat at age 62.   Follow up in 1 year for annual.     Tamara Brennan [facility], 10:44 AM 11/15/2022

## 2023-01-04 ENCOUNTER — Other Ambulatory Visit: Payer: Self-pay | Admitting: Family Medicine

## 2023-01-04 DIAGNOSIS — Z1231 Encounter for screening mammogram for malignant neoplasm of breast: Secondary | ICD-10-CM

## 2023-02-14 ENCOUNTER — Ambulatory Visit
Admission: RE | Admit: 2023-02-14 | Discharge: 2023-02-14 | Disposition: A | Discharge status: Home / Self Care | Payer: 59 | Source: Ambulatory Visit | Attending: Family Medicine | Admitting: Family Medicine

## 2023-02-14 DIAGNOSIS — Z1231 Encounter for screening mammogram for malignant neoplasm of breast: Secondary | ICD-10-CM

## 2023-03-10 ENCOUNTER — Ambulatory Visit: Payer: 59 | Admitting: Nurse Practitioner

## 2023-03-10 NOTE — Progress Notes (Deleted)
   Tamara Brennan 07-10-1960 [id number]   History:  63 y.o. [id number] presents for annual exam. Postmenopausal - no HRT (stopped a few years ago). S/P 1995 TVH for fibroids. Normal pap history. History of subtotal thyroidectomy, vitamin D deficiency. HSV - rare outbreaks.   Gynecologic History No LMP recorded. Patient has had a hysterectomy.   Contraception/Family planning: status post hysterectomy Sexually active: Yes  Health Maintenance Last Pap: 08/26/2011. Results were: Normal Last mammogram: 02/14/2023 Results were: Normal Last colonoscopy: 2013. Results were: Normal Last Dexa: 08/01/2018. Results were: Normal  Past medical history, past surgical history, family history and social history were all reviewed and documented in the EPIC chart. Married. Works with pre-k. 2 daughters ages 43 and 33, both in [place].   ROS:  A ROS was performed and pertinent positives and negatives are included.  Exam:  There were no vitals filed for this visit.    There is no height or weight on file to calculate BMI.  General appearance:  Normal Thyroid:  Symmetrical, normal in size, without palpable masses or nodularity. Respiratory  Auscultation:  Clear without wheezing or rhonchi Cardiovascular  Auscultation:  Regular rate, without rubs, murmurs or gallops  Edema/varicosities:  Not grossly evident Abdominal  Soft,nontender, without masses, guarding or rebound.  Liver/spleen:  No organomegaly noted  Hernia:  None appreciated  Skin  Inspection:  Grossly normal   Breasts: Examined lying and sitting.   Right: Without masses, retractions, discharge or axillary adenopathy.   Left: Without masses, retractions, discharge or axillary adenopathy. Pelvic: External genitalia:  no lesions              Urethra:  normal appearing urethra with no masses, tenderness or lesions              Bartholins and Skenes: normal                 Vagina: normal appearing vagina with normal color and discharge,  no lesions              Cervix: absent Bimanual Exam:  Uterus: absent              Adnexa: no mass, fullness, tenderness              Rectovaginal: Deferred              Anus:  normal, no lesions  Patient informed chaperone available to be present for breast and pelvic exam. Patient has requested no chaperone to be present. Patient has been advised what will be completed during breast and pelvic exam.   Assessment/Plan:  63 y.o. D3U2025 for annual exam.   Well female exam with routine gynecological exam -Education provided on SBEs, importance of preventative screenings, current guidelines, high calcium diet, regular exercise, and multivitamin daily. Labs with PCP.   HSV infection - Valtrex as needed. Rare outbreaks.   Screening for cervical cancer - Normal Pap history. No longer screening per guidelines.   Screening for breast cancer - Normal mammogram history.  Continue annual screenings. Normal breast exam today.  Screening for colon cancer - 2013 colonoscopy. Consultation scheduled with GI in November.   Screening for osteoporosis - Normal bone density in 2020. Will repeat at age 63.   No follow-ups on file.     [name] [facility], 8:02 AM 03/10/2023

## 2023-05-17 ENCOUNTER — Encounter: Payer: Self-pay | Admitting: Nurse Practitioner

## 2023-05-17 ENCOUNTER — Ambulatory Visit (INDEPENDENT_AMBULATORY_CARE_PROVIDER_SITE_OTHER): Payer: 59 | Admitting: Nurse Practitioner

## 2023-05-17 VITALS — BP 122/86 | HR 73 | Ht 66.75 in | Wt 216.0 lb

## 2023-05-17 DIAGNOSIS — Z01419 Encounter for gynecological examination (general) (routine) without abnormal findings: Secondary | ICD-10-CM

## 2023-05-17 DIAGNOSIS — B009 Herpesviral infection, unspecified: Secondary | ICD-10-CM | POA: Diagnosis not present

## 2023-05-17 DIAGNOSIS — Z780 Asymptomatic menopausal state: Secondary | ICD-10-CM

## 2023-05-17 NOTE — Progress Notes (Signed)
 RAYETTA VEITH [birth year] [id number]   History:  63 y.o. Q2V9563 presents for annual exam. Postmenopausal - no HRT (stopped a few years ago). S/P 1995 TVH for fibroids. Normal pap history. History of subtotal thyroidectomy, vitamin D deficiency. HSV - rare outbreaks.   Gynecologic History No LMP recorded. Patient has had a hysterectomy.   Contraception/Family planning: status post hysterectomy Sexually active: Yes  Health Maintenance Last Pap: 08/26/2011. Results were: Normal Last mammogram: 02/14/2023. Results were: Normal Last colonoscopy: A couple years ago per patient Last Dexa: 08/01/2018. Results were: Normal  Past medical history, past surgical history, family history and social history were all reviewed and documented in the EPIC chart. 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 Joined senior group, working out daily. 2 daughters ages 34 and 53, both in [place].   ROS:  A ROS was performed and pertinent positives and negatives are included.  Exam:  Vitals:   05/17/23 1208  BP: 122/86  Pulse: 73  SpO2: 95%  Weight: 216 lb (98 kg)  Height: 5' 6.75" (1.695 m)      Body mass index is 34.08 kg/m.  General appearance:  Normal Thyroid:  Symmetrical, normal in size, without palpable masses or nodularity. Respiratory  Auscultation:  Clear without wheezing or rhonchi Cardiovascular  Auscultation:  Regular rate, without rubs, murmurs or gallops  Edema/varicosities:  Not grossly evident Abdominal  Soft,nontender, without masses, guarding or rebound.  Liver/spleen:  No organomegaly noted  Hernia:  None appreciated  Skin  Inspection:  Grossly normal   Breasts: Examined lying and sitting.   Right: Without masses, retractions, discharge or axillary adenopathy.   Left: Without masses, retractions, discharge or axillary adenopathy. Pelvic: External genitalia:  no lesions              Urethra:  normal appearing urethra with no masses, tenderness or lesions               Bartholins and Skenes: normal                 Vagina: normal appearing vagina with normal color and discharge, no lesions              Cervix: absent Bimanual Exam:  Uterus:  absent              Adnexa: no mass, fullness, tenderness              Rectovaginal: Deferred              Anus:  normal, no lesions  Patient informed chaperone available to be present for breast and pelvic exam. Patient has requested no chaperone to be present. Patient has been advised what will be completed during breast and pelvic exam.   Assessment/Plan:  63 y.o. [id number] for annual exam.   Well female exam with routine gynecological exam -Education provided on SBEs, importance of preventative screenings, current guidelines, high calcium diet, regular exercise, and multivitamin daily. Labs with PCP.   Postmenopausal - stopped HRT a few years ago  HSV infection - Valtrex as needed. Rare outbreaks.   Screening for cervical cancer - Normal Pap history. No longer screening per guidelines.   Screening for breast cancer - Normal mammogram history.  Continue annual screenings. Normal breast exam today.  Screening for colon cancer - colonoscopy a couple of years ago  Screening for osteoporosis - Normal bone density in 2020. Will repeat at age 63.   Return in about 1 year (around 05/16/2024) for Annual.  [name] [facility], 12:26 PM 05/17/2023

## 2023-05-23 ENCOUNTER — Telehealth: Payer: Self-pay

## 2023-05-23 NOTE — Telephone Encounter (Signed)
 Last AEX: 05/17/2023-TW Next AEX: 05/17/2024-TW Last DXA: 08/01/2018-WNL (here) Meds (if on tx for osteoporosis): n/a

## 2023-05-23 NOTE — Telephone Encounter (Signed)
 Pt LVM in triage line stating that she needs to schedule bone density scan. Requesting a cb.

## 2023-05-24 NOTE — Telephone Encounter (Signed)
 Wyline Beady A, NP  You1 hour ago (10:47 AM)    Oh yes, that's true. Can wait until age 63. No risk factors. Thanks, Noreene Larsson.   Patient notified.   Encounter closed.

## 2023-05-24 NOTE — Telephone Encounter (Signed)
 Per review of AEX 05/17/23, 2020 BMD normal, repeat at age 63.   Please confirm.

## 2023-06-14 ENCOUNTER — Ambulatory Visit: Admitting: Podiatry

## 2023-06-14 ENCOUNTER — Encounter: Payer: Self-pay | Admitting: Podiatry

## 2023-06-14 DIAGNOSIS — M7751 Other enthesopathy of right foot: Secondary | ICD-10-CM | POA: Diagnosis not present

## 2023-06-14 DIAGNOSIS — M7752 Other enthesopathy of left foot: Secondary | ICD-10-CM | POA: Diagnosis not present

## 2023-06-14 MED ORDER — MELOXICAM 15 MG PO TABS: 15.0000 mg | ORAL_TABLET | Freq: Every day | ORAL | 0 refills | Status: DC

## 2023-06-14 NOTE — Progress Notes (Signed)
  Subjective:  Patient ID: Tamara Brennan, female    DOB: 1960-05-30,   MRN: [id number]  No chief complaint on file.   63 y.o. female presents for concern of bilateral ankle pain that has been ongoing for about a month. Relates she recently did some double [place] and after that started having pain. She also relates chronic swelling in the ankles.  . Denies any other pedal complaints. Denies n/v/f/c.   Past Medical History:  Diagnosis Date   STD (sexually transmitted disease)    HSV ll    Objective:  Physical Exam: Vascular: DP/PT pulses 2/4 bilateral. CFT <3 seconds. Normal hair growth on digits. No edema.  Skin. No lacerations or abrasions bilateral feet.  Musculoskeletal: MMT 5/5 bilateral lower extremities in DF, PF, Inversion and Eversion. Deceased ROM in DF of ankle joint. Tender to ATFL area and sinus tarsi area. No pain with ROM of ankle or subtalar joint. No pain along peroneals. Both ankles tender equally with edema present Neurological: Sensation intact to light touch.   Assessment:   1. Capsulitis of ankle, left [M77.52]   2. Capsulitis of ankle, right [M77.51]      Plan:  Patient was evaluated and treated and all questions answered. Discussed ankle sprain vs capsulitis of ankle. and treatment options at length with patient Discussed stretching exercises and provided handout. Prescription for meloxicam provided Dispensed Tri-Lock ankle brace bilateral Discussed that if the symptoms do not improve can consider PT/MRI. Patient to return in 6 to 8 weeks or sooner if symptoms fail to improve or worsen.   Jennefer Moats, DPM

## 2023-06-14 NOTE — Patient Instructions (Signed)
Ankle Sprain, Phase I Rehab An ankle sprain is an injury to the tissues that connect bone to bone (ligaments) in your ankle. Ankle sprains can cause stiffness, loss of motion, and loss of strength. Ask your health care provider which exercises are safe for you. Do exercises exactly as told by your provider and adjust them as directed. It is normal to feel mild stretching, pulling, tightness, or discomfort as you do these exercises. Stop right away if you feel sudden pain or your pain gets worse. Do not begin these exercises until told by your provider. Stretching and range-of-motion exercises These exercises warm up your muscles and joints. They can improve the movement and flexibility of your lower leg and ankle. They also help to relieve pain and stiffness. Gastroc and soleus stretch This exercise is also called a calf stretch. It stretches the muscles in the back of the lower leg. These muscles are the gastrocnemius, or gastroc, and the soleus. Sit on the floor with your left / right leg extended. Loop a belt or towel around the ball of your left / right foot. The ball of your foot is on the walking surface, right under your toes. Keep your left / right ankle and foot relaxed and keep your knee straight. Use the belt or towel to pull your foot toward you. You should feel a gentle stretch behind your calf or knee in your gastroc muscle. Hold this position for __________ seconds, then release to the starting position. Repeat the exercise with your knee bent. You can put a pillow or a rolled bath towel under your knee to support it. You should feel a stretch deep in your calf in the soleus muscle or at your Achilles tendon. Repeat __________ times. Complete this exercise __________ times a day. Ankle alphabet  Sit with your left / right leg supported at the lower leg. Do not rest your foot on anything. Make sure your foot has room to move freely. Think of your left / right foot as a  paintbrush. Move your foot to trace each letter of the alphabet in the air. Keep your hip and knee still while you trace. Make the letters as large as you can without feeling discomfort. Trace every letter from A to Z. Repeat __________ times. Complete this exercise __________ times a day. Strengthening exercises These exercises build strength and endurance in your ankle and lower leg. Endurance is the ability to use your muscles for a long time, even after they get tired. Ankle dorsiflexion  Secure a rubber exercise band or tube to an object, such as a table leg, that will stay still when the band is pulled. Secure the other end around your left / right foot. Sit on the floor facing the object, with your left / right leg extended. The band or tube should be slightly tense when your foot is relaxed. Slowly bring your foot toward you, bringing the top of your foot toward your shin (dorsiflexion), and pulling the band tighter. Hold this position for __________ seconds. Slowly return your foot to the starting position. Repeat __________ times. Complete this exercise __________ times a day. Ankle plantar flexion  Sit on the floor with your left / right leg extended. Loop a rubber exercise tube or band around the ball of your left / right foot. The ball of your foot is on the walking surface, right under your toes. Hold the ends of the band or tube in your hands. The band or tube should be   slightly tense when your foot is relaxed. Slowly point your foot and toes downward to tilt the top of your foot away from your shin (plantar flexion). Hold this position for __________ seconds. Slowly return your foot to the starting position. Repeat __________ times. Complete this exercise __________ times a day. Ankle eversion  Sit on the floor with your legs straight out in front of you. Loop a rubber exercise band or tube around the ball of your left / right foot. The ball of your foot is on the walking  surface, right under your toes. Hold the ends of the band in your hands or secure the band to a stable object. The band or tube should be slightly tense when your foot is relaxed. Slowly push your foot outward, away from your other leg (eversion). Hold this position for __________ seconds. Slowly return your foot to the starting position. Repeat __________ times. Complete this exercise __________ times a day. This information is not intended to replace advice given to you by your health care provider. Make sure you discuss any questions you have with your health care provider. Document Revised: 11/25/2021 Document Reviewed: 11/25/2021 [employer] Patient Education  2024 [employer]  

## 2023-06-15 ENCOUNTER — Ambulatory Visit
Admission: RE | Admit: 2023-06-15 | Discharge: 2023-06-15 | Disposition: A | Discharge status: Home / Self Care | Source: Ambulatory Visit | Attending: Family Medicine | Admitting: Family Medicine

## 2023-06-15 ENCOUNTER — Other Ambulatory Visit: Payer: Self-pay | Admitting: Family Medicine

## 2023-06-15 DIAGNOSIS — R0609 Other forms of dyspnea: Secondary | ICD-10-CM

## 2023-06-15 DIAGNOSIS — R058 Other specified cough: Secondary | ICD-10-CM

## 2023-07-12 ENCOUNTER — Other Ambulatory Visit: Payer: Self-pay | Admitting: Podiatry

## 2023-07-14 ENCOUNTER — Other Ambulatory Visit: Payer: Self-pay | Admitting: Family Medicine

## 2023-07-14 DIAGNOSIS — N644 Mastodynia: Secondary | ICD-10-CM

## 2023-07-21 ENCOUNTER — Ambulatory Visit
Admission: RE | Admit: 2023-07-21 | Discharge: 2023-07-21 | Disposition: A | Discharge status: Home / Self Care | Source: Ambulatory Visit | Attending: Family Medicine | Admitting: Family Medicine

## 2023-07-21 DIAGNOSIS — N644 Mastodynia: Secondary | ICD-10-CM

## 2023-07-27 ENCOUNTER — Ambulatory Visit: Admitting: Podiatry

## 2023-12-05 IMAGING — CR DG LUMBAR SPINE COMPLETE 4+V
5 series · 5 of 5 positions shown · non-contrast
Comparison: July 28, 2004

CLINICAL DATA: Lower back pain and left hip pain.

EXAM:
LUMBAR SPINE - COMPLETE 4+ VIEW

[t l-spine a.p.]
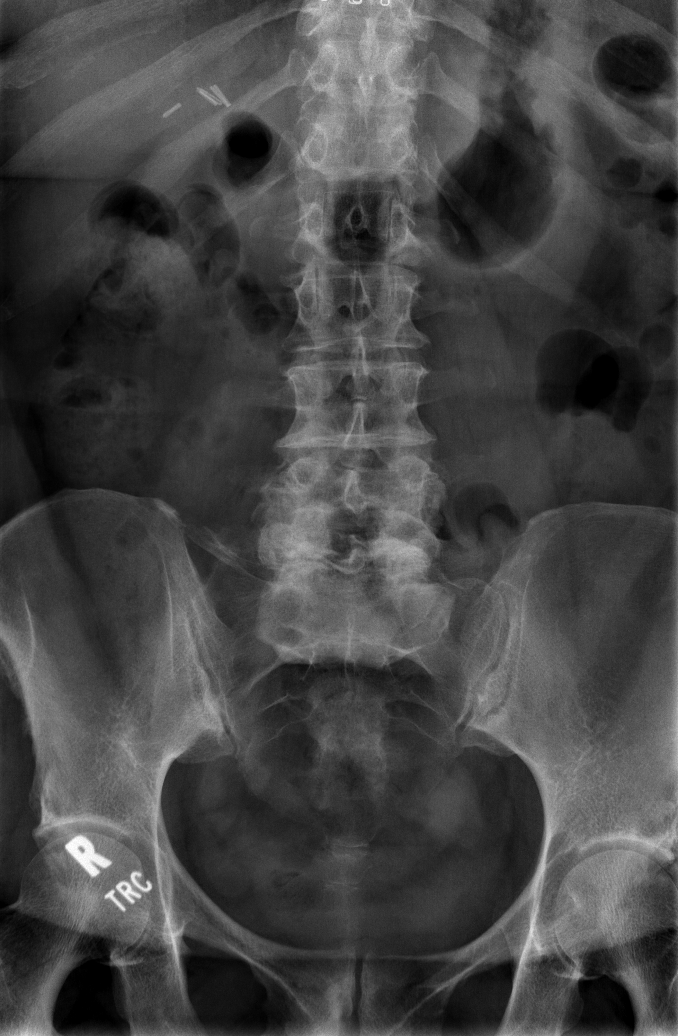

[t l-spine oblique exposure (1 of 2)]
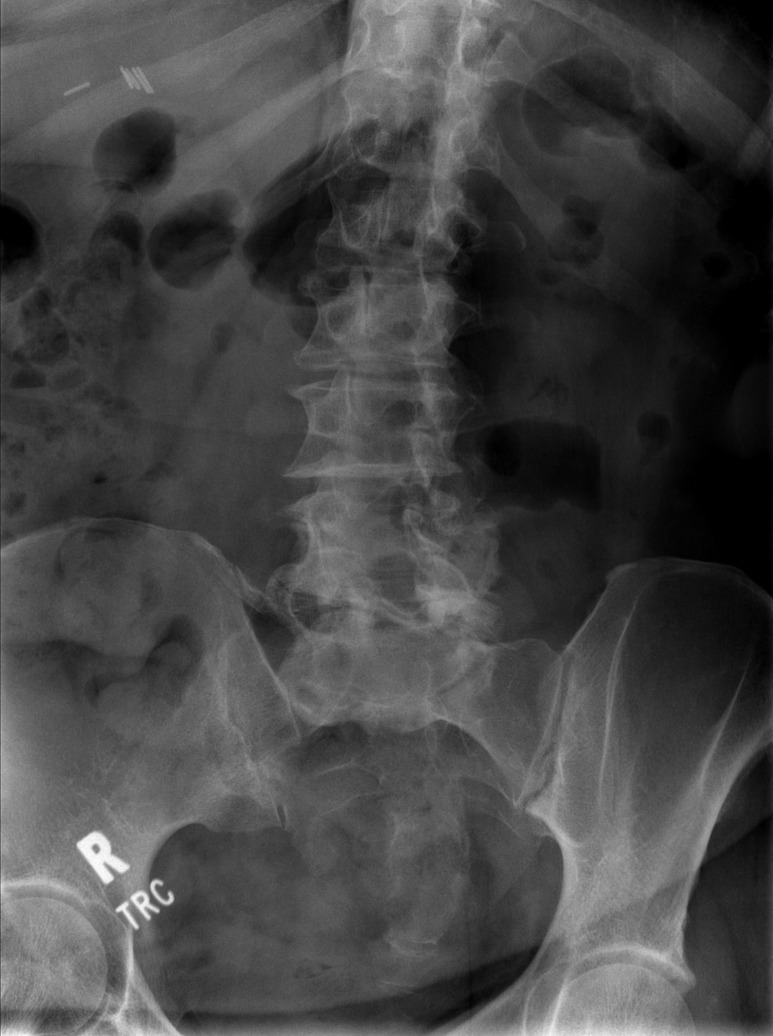

[t l-spine oblique exposure (2 of 2)]
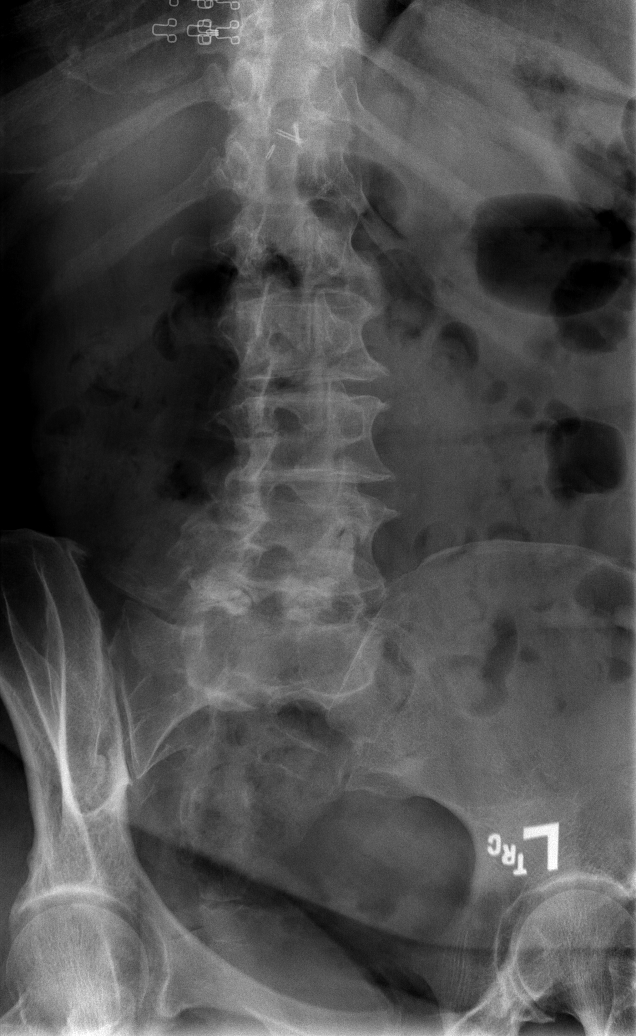

[t l-spine lat]
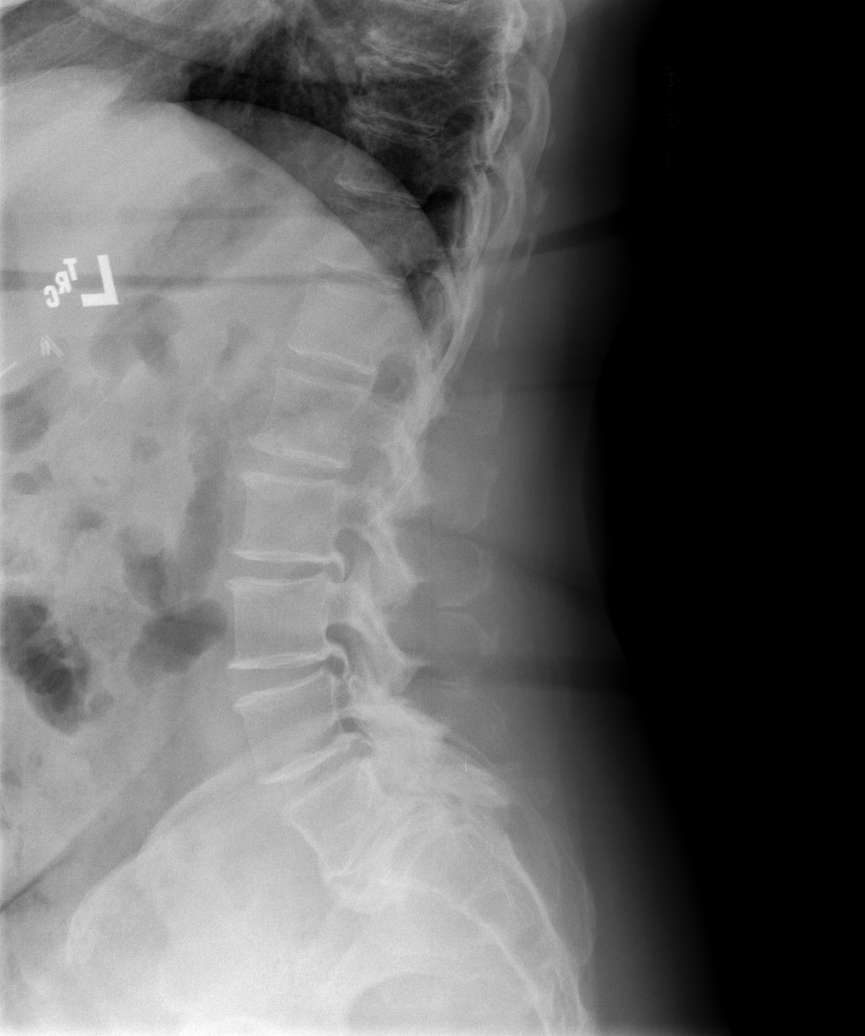

[t l-spine l5-s1 spot]
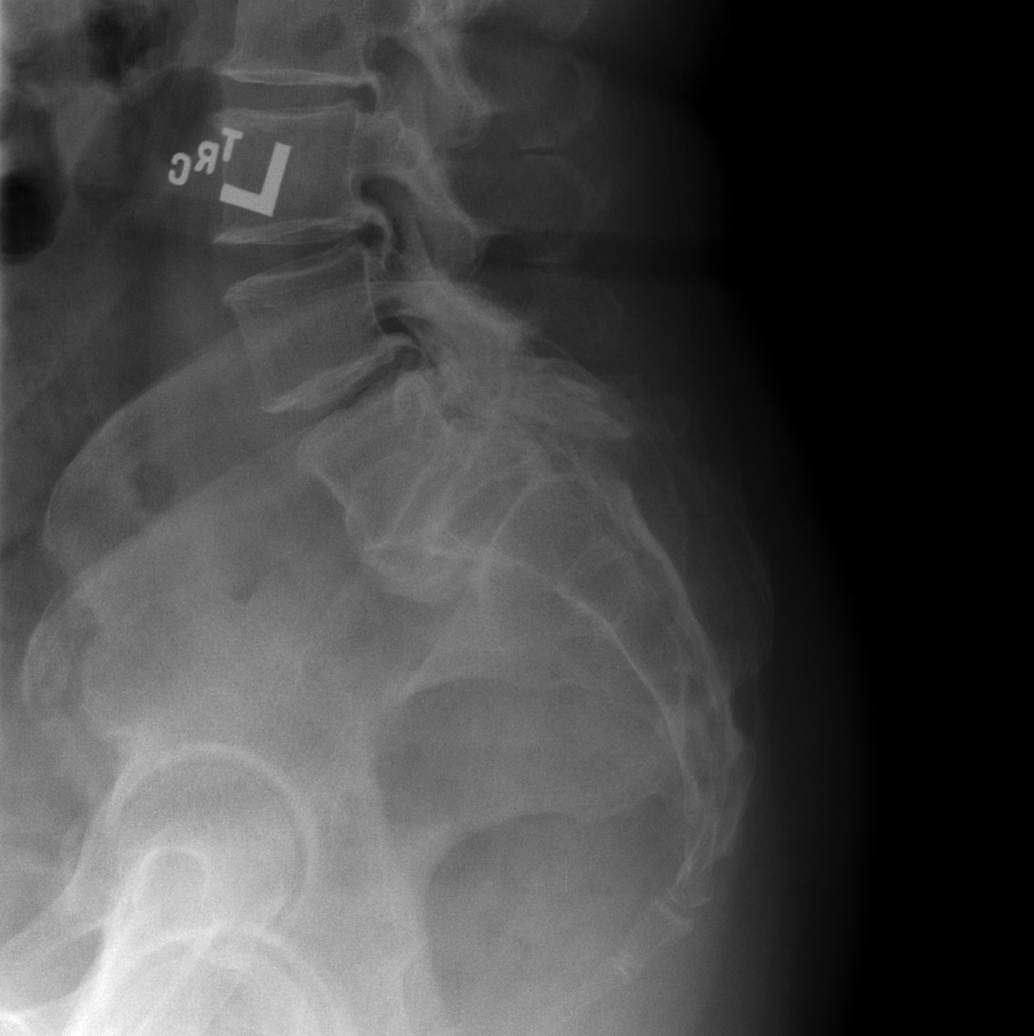

[5 of 5 positions shown; findings below may reference images not displayed]

FINDINGS: There is no evidence of an acute lumbar spine fracture. Stable grade
1 anterolisthesis of the L5 vertebral body is noted on S1. Marked
severity endplate sclerosis is seen at L5-S1, with moderate severity
endplate sclerosis noted throughout the remainder of the lumbar
spine. Marked severity bilateral facet joint hypertrophy is present
at the levels of L4-L5 and L5-S1. Marked severity intervertebral
disc space narrowing is seen at L5-S1.
IMPRESSION: 1. Multilevel degenerative changes, most prominent at L5-S1.
2. Stable grade 1 anterolisthesis of the L5 vertebral body on S1.
3. No acute fracture.

## 2024-01-09 ENCOUNTER — Ambulatory Visit: Admitting: Podiatry

## 2024-01-09 ENCOUNTER — Encounter: Payer: Self-pay | Admitting: Podiatry

## 2024-01-09 ENCOUNTER — Ambulatory Visit

## 2024-01-09 DIAGNOSIS — M2041 Other hammer toe(s) (acquired), right foot: Secondary | ICD-10-CM | POA: Diagnosis not present

## 2024-01-09 DIAGNOSIS — M205X1 Other deformities of toe(s) (acquired), right foot: Secondary | ICD-10-CM

## 2024-01-09 DIAGNOSIS — M2042 Other hammer toe(s) (acquired), left foot: Secondary | ICD-10-CM | POA: Diagnosis not present

## 2024-01-09 NOTE — Progress Notes (Signed)
  Subjective:  Patient ID: Tamara Brennan, female    DOB: November 04, 1960,   MRN: [id number]  Chief Complaint  Patient presents with   Toe Pain    My right big toe has been giving me pain.  On both of my small toes, I am having pain when I wear shoes.    63 y.o. female presents for concern of right great toe as well as pain with a little fifth toes bilateral.  Relates this has been going on for a while and is worse when wearing certain shoes.  She relates rubbing on the outside of the pinky toes.  She relates for a while now her great great toe has been hurting especially after increased activity.  Denies any treatments currently.. Denies any other pedal complaints. Denies n/v/f/c.   Past Medical History:  Diagnosis Date   STD (sexually transmitted disease)    HSV ll    Objective:  Physical Exam: Vascular: DP/PT pulses 2/4 bilateral. CFT <3 seconds. Normal hair growth on digits. No edema.  Skin. No lacerations or abrasions bilateral feet.  Hyperkeratotic lesions noted to dorsal lateral fifth digits bilateral Musculoskeletal: MMT 5/5 bilateral lower extremities in DF, PF, Inversion and Eversion. Deceased ROM in DF of ankle joint. Adductovarus noted of bilatearl fifth digits.  Mild tenderness with range of motion mostly in range of motion on the right first MPJ.  No pain to palpation elsewhere.  No pain on the left foot. Neurological: Sensation intact to light touch.   Assessment:   1. Hallux limitus of right foot   2. Hammer toes of both feet      Plan:  Patient was evaluated and treated and all questions answered. -Xrays reviewed. No acute fractures or dislocations noted. There is mild degenerative changes noted at right first MPJ. Previous hardware from bilateral bunion procedures. Bilateral fifth digit adductovarus noted.  -Discussed hallux limitus and  treatement options; conservative and  Surgical management; risks, benefits, alternatives discussed. All patient's questions  answered. -Discussed topical anti-inflammatories as well as oral medications.  Will defer oral medications at this time. -Hyperkeratotic lesions to fifth digits debrided as courtesy bilateral.  Discussed toe caps and protection.  -Recommend continue with good supportive shoes and inserts. Discussed stiff soled shoes and use of carbon fiber foot plate.   -Discussed getting remeasured for shoes and wearing stiff soled shoes. -Patient to return to office as needed or sooner if condition worsens.   Asberry Failing, DPM

## 2024-05-17 ENCOUNTER — Ambulatory Visit: Admitting: Nurse Practitioner
